# Patient Record
Sex: Female | Born: 1935 | Race: White | Hispanic: No | Smoking: Never smoker
Health system: Southern US, Community
[De-identification: ages and names within clinical notes are randomized; demographics above are authoritative.]

## PROBLEM LIST (undated history)

## (undated) DIAGNOSIS — I509 Heart failure, unspecified: Secondary | ICD-10-CM

## (undated) DIAGNOSIS — G629 Polyneuropathy, unspecified: Secondary | ICD-10-CM

## (undated) DIAGNOSIS — I10 Essential (primary) hypertension: Secondary | ICD-10-CM

## (undated) DIAGNOSIS — E119 Type 2 diabetes mellitus without complications: Secondary | ICD-10-CM

## (undated) DIAGNOSIS — M1990 Unspecified osteoarthritis, unspecified site: Secondary | ICD-10-CM

## (undated) HISTORY — PX: OTHER SURGICAL HISTORY: SHX169

---

## 1998-06-22 ENCOUNTER — Ambulatory Visit (HOSPITAL_COMMUNITY): Admission: RE | Admit: 1998-06-22 | Discharge: 1998-06-22 | Payer: Self-pay | Admitting: Cardiovascular Disease

## 2013-10-08 ENCOUNTER — Encounter (HOSPITAL_COMMUNITY): Payer: Self-pay | Admitting: Emergency Medicine

## 2013-10-08 ENCOUNTER — Emergency Department (HOSPITAL_COMMUNITY): Payer: Medicare HMO

## 2013-10-08 ENCOUNTER — Observation Stay (HOSPITAL_COMMUNITY)
Admission: EM | Admit: 2013-10-08 | Discharge: 2013-10-09 | Disposition: A | Discharge status: Home / Self Care | Payer: Medicare HMO | Attending: Internal Medicine | Admitting: Internal Medicine

## 2013-10-08 DIAGNOSIS — Z79899 Other long term (current) drug therapy: Secondary | ICD-10-CM | POA: Diagnosis not present

## 2013-10-08 DIAGNOSIS — E785 Hyperlipidemia, unspecified: Secondary | ICD-10-CM | POA: Diagnosis present

## 2013-10-08 DIAGNOSIS — M129 Arthropathy, unspecified: Secondary | ICD-10-CM | POA: Diagnosis not present

## 2013-10-08 DIAGNOSIS — Z888 Allergy status to other drugs, medicaments and biological substances status: Secondary | ICD-10-CM | POA: Diagnosis not present

## 2013-10-08 DIAGNOSIS — E119 Type 2 diabetes mellitus without complications: Secondary | ICD-10-CM | POA: Diagnosis not present

## 2013-10-08 DIAGNOSIS — G459 Transient cerebral ischemic attack, unspecified: Secondary | ICD-10-CM | POA: Diagnosis present

## 2013-10-08 DIAGNOSIS — R29898 Other symptoms and signs involving the musculoskeletal system: Secondary | ICD-10-CM | POA: Diagnosis present

## 2013-10-08 DIAGNOSIS — D649 Anemia, unspecified: Secondary | ICD-10-CM | POA: Diagnosis not present

## 2013-10-08 DIAGNOSIS — R5383 Other fatigue: Secondary | ICD-10-CM

## 2013-10-08 DIAGNOSIS — I10 Essential (primary) hypertension: Secondary | ICD-10-CM | POA: Diagnosis present

## 2013-10-08 DIAGNOSIS — I509 Heart failure, unspecified: Secondary | ICD-10-CM | POA: Diagnosis not present

## 2013-10-08 DIAGNOSIS — R4182 Altered mental status, unspecified: Secondary | ICD-10-CM | POA: Diagnosis not present

## 2013-10-08 DIAGNOSIS — R531 Weakness: Secondary | ICD-10-CM | POA: Diagnosis present

## 2013-10-08 DIAGNOSIS — M109 Gout, unspecified: Secondary | ICD-10-CM | POA: Diagnosis present

## 2013-10-08 DIAGNOSIS — Z886 Allergy status to analgesic agent status: Secondary | ICD-10-CM | POA: Diagnosis not present

## 2013-10-08 DIAGNOSIS — R5381 Other malaise: Secondary | ICD-10-CM

## 2013-10-08 HISTORY — DX: Polyneuropathy, unspecified: G62.9

## 2013-10-08 HISTORY — DX: Heart failure, unspecified: I50.9

## 2013-10-08 HISTORY — DX: Type 2 diabetes mellitus without complications: E11.9

## 2013-10-08 HISTORY — DX: Essential (primary) hypertension: I10

## 2013-10-08 HISTORY — DX: Unspecified osteoarthritis, unspecified site: M19.90

## 2013-10-08 LAB — I-STAT TROPONIN, ED: Troponin i, poc: 0 ng/mL (ref 0.00–0.08)

## 2013-10-08 LAB — BASIC METABOLIC PANEL
Anion gap: 11 (ref 5–15)
BUN: 23 mg/dL (ref 6–23)
CO2: 30 mEq/L (ref 19–32)
Calcium: 9.6 mg/dL (ref 8.4–10.5)
Chloride: 96 mEq/L (ref 96–112)
Creatinine, Ser: 1.15 mg/dL — ABNORMAL HIGH (ref 0.50–1.10)
GFR calc Af Amer: 50 mL/min — ABNORMAL LOW (ref >90)
GFR calc non Af Amer: 43 mL/min — ABNORMAL LOW (ref >90)
Glucose, Bld: 128 mg/dL — ABNORMAL HIGH (ref 70–99)
Potassium: 3.9 mEq/L (ref 3.7–5.3)
Sodium: 137 mEq/L (ref 137–147)

## 2013-10-08 LAB — I-STAT CHEM 8, ED
BUN: 23 mg/dL (ref 6–23)
Calcium, Ion: 1.17 mmol/L (ref 1.13–1.30)
Chloride: 96 mEq/L (ref 96–112)
Creatinine, Ser: 1.2 mg/dL — ABNORMAL HIGH (ref 0.50–1.10)
Glucose, Bld: 136 mg/dL — ABNORMAL HIGH (ref 70–99)
HCT: 36 % (ref 36.0–46.0)
Hemoglobin: 12.2 g/dL (ref 12.0–15.0)
Potassium: 3.8 mEq/L (ref 3.7–5.3)
Sodium: 133 mEq/L — ABNORMAL LOW (ref 137–147)
TCO2: 29 mmol/L (ref 0–100)

## 2013-10-08 LAB — URINALYSIS, ROUTINE W REFLEX MICROSCOPIC
Bilirubin Urine: NEGATIVE
Glucose, UA: NEGATIVE mg/dL
Hgb urine dipstick: NEGATIVE
Ketones, ur: NEGATIVE mg/dL
LEUKOCYTES UA: NEGATIVE
NITRITE: NEGATIVE
PROTEIN: NEGATIVE mg/dL
Specific Gravity, Urine: 1.01 (ref 1.005–1.030)
UROBILINOGEN UA: 0.2 mg/dL (ref 0.0–1.0)
pH: 6 (ref 5.0–8.0)

## 2013-10-08 LAB — CBC
HCT: 35 % — ABNORMAL LOW (ref 36.0–46.0)
Hemoglobin: 11.6 g/dL — ABNORMAL LOW (ref 12.0–15.0)
MCH: 28.4 pg (ref 26.0–34.0)
MCHC: 33.1 g/dL (ref 30.0–36.0)
MCV: 85.8 fL (ref 78.0–100.0)
Platelets: 275 10*3/uL (ref 150–400)
RBC: 4.08 MIL/uL (ref 3.87–5.11)
RDW: 12.7 % (ref 11.5–15.5)
WBC: 9.4 10*3/uL (ref 4.0–10.5)

## 2013-10-08 LAB — GLUCOSE, CAPILLARY: GLUCOSE-CAPILLARY: 115 mg/dL — AB (ref 70–99)

## 2013-10-08 LAB — SEDIMENTATION RATE: SED RATE: 72 mm/h — AB (ref 0–22)

## 2013-10-08 LAB — C-REACTIVE PROTEIN: CRP: 3 mg/dL — AB (ref <0.60)

## 2013-10-08 MED ORDER — ACETAMINOPHEN 325 MG PO TABS
650.0000 mg | ORAL_TABLET | ORAL | Status: DC | PRN
  Administered 2013-10-09: 650 mg via ORAL
  Filled 2013-10-08: qty 2

## 2013-10-08 MED ORDER — LABETALOL HCL 100 MG PO TABS
100.0000 mg | ORAL_TABLET | Freq: Two times a day (BID) | ORAL | Status: DC
  Administered 2013-10-09 (×2): 100 mg via ORAL
  Filled 2013-10-08 (×2): qty 1

## 2013-10-08 MED ORDER — IRBESARTAN 300 MG PO TABS
300.0000 mg | ORAL_TABLET | Freq: Every day | ORAL | Status: DC
  Administered 2013-10-09 (×2): 300 mg via ORAL
  Filled 2013-10-08 (×2): qty 1

## 2013-10-08 MED ORDER — INSULIN ASPART 100 UNIT/ML ~~LOC~~ SOLN: 0.0000 [IU] | Freq: Three times a day (TID) | SUBCUTANEOUS | Status: DC

## 2013-10-08 MED ORDER — VITAMIN D 1000 UNITS PO TABS: 1000.0000 [IU] | ORAL_TABLET | Freq: Every day | ORAL | Status: DC

## 2013-10-08 MED ORDER — ENOXAPARIN SODIUM 40 MG/0.4ML ~~LOC~~ SOLN
40.0000 mg | SUBCUTANEOUS | Status: DC
  Administered 2013-10-09: 40 mg via SUBCUTANEOUS
  Filled 2013-10-08: qty 0.4

## 2013-10-08 MED ORDER — OMEGA-3-ACID ETHYL ESTERS 1 G PO CAPS
2.0000 g | ORAL_CAPSULE | Freq: Every day | ORAL | Status: DC
  Administered 2013-10-09: 2 g via ORAL
  Filled 2013-10-08: qty 2

## 2013-10-08 MED ORDER — ACETAMINOPHEN 500 MG PO TABS
1000.0000 mg | ORAL_TABLET | Freq: Once | ORAL | Status: AC
  Administered 2013-10-08: 1000 mg via ORAL
  Filled 2013-10-08: qty 2

## 2013-10-08 MED ORDER — CHROMIUM 200 MCG PO TABS: 200.0000 ug | ORAL_TABLET | Freq: Every day | ORAL | Status: DC

## 2013-10-08 MED ORDER — BIOTIN 5 MG PO TABS: 5.0000 mg | ORAL_TABLET | Freq: Every morning | ORAL | Status: DC

## 2013-10-08 MED ORDER — ALLOPURINOL 100 MG PO TABS: 300.0000 mg | ORAL_TABLET | ORAL | Status: DC

## 2013-10-08 MED ORDER — LORAZEPAM 2 MG/ML IJ SOLN: 1.0000 mg | Freq: Once | INTRAMUSCULAR | Status: DC

## 2013-10-08 MED ORDER — STROKE: EARLY STAGES OF RECOVERY BOOK
Freq: Once | Status: AC
  Administered 2013-10-08: 23:00:00
  Filled 2013-10-08: qty 1

## 2013-10-08 MED ORDER — SODIUM CHLORIDE 0.9 % IV SOLN
INTRAVENOUS | Status: DC
  Administered 2013-10-09: 02:00:00 via INTRAVENOUS

## 2013-10-08 MED ORDER — FISH OIL PLUS CO Q-10 1000-30 MG PO CAPS: 2.0000 | ORAL_CAPSULE | Freq: Every day | ORAL | Status: DC

## 2013-10-08 MED ORDER — LORATADINE 10 MG PO TABS
10.0000 mg | ORAL_TABLET | Freq: Every day | ORAL | Status: DC
  Administered 2013-10-09: 10 mg via ORAL
  Filled 2013-10-08: qty 1

## 2013-10-08 MED ORDER — ATORVASTATIN CALCIUM 10 MG PO TABS: 10.0000 mg | ORAL_TABLET | Freq: Every day | ORAL | Status: DC

## 2013-10-08 MED ORDER — OCUVITE PO TABS
1.0000 | ORAL_TABLET | Freq: Every day | ORAL | Status: DC
  Administered 2013-10-09 (×2): 1 via ORAL
  Filled 2013-10-08 (×2): qty 1

## 2013-10-08 NOTE — Consult Note (Signed)
Neurology Consultation Reason for Consult: Lower extremity weakness Referring Physician: Venora Maples, K.  CC: Lower extremity weakness  History is obtained from: Patient, daughter  HPI: Sherri Long is a 80 y.o. female who has had 4 episodes of transient altered mental status associated with a lower extremity weakness. She states that her daughter was talking to her, but she just felt comfortable and did not feel the need to respond. She understood what was being said to her.  She has memory of the event. She recovered with with each of the events over the course of a few minutes.  Also of note, she has recently had shoulder girdle pain and stiffness that progressed over the course of a couple of weeks. She was started on prednisone with rapid and complete resolution of the symptoms, however after stopping prednisone she has had recurrence.  ROS: A 14 point ROS was performed and is negative except as noted in the HPI.   Past Medical History  Diagnosis Date  . Arthritis   . Neuropathy   . Diabetes mellitus without complication   . Hypertension   . CHF (congestive heart failure)     Family History: Grandchildren-? Seizures  Social History: Tob: Denies  Exam: Current vital signs: BP 175/74  Pulse 71  Temp(Src) 100.6 F (38.1 C) (Rectal)  Resp 26  SpO2 98% Vital signs in last 24 hours: Temp:  [98.3 F (36.8 C)-100.6 F (38.1 C)] 100.6 F (38.1 C) (08/20 1913) Pulse Rate:  [60-71] 71 (08/20 2000) Resp:  [16-26] 26 (08/20 1937) BP: (156-175)/(64-76) 175/74 mmHg (08/20 2000) SpO2:  [98 %-100 %] 98 % (08/20 1937)  General: In bed, NAD CV: Regular rate and rhythm Mental Status: Patient is awake, alert, oriented to person, place, month, year, and situation. Immediate and remote memory are intact. Patient is able to give a clear and coherent history. No signs of aphasia or neglect Cranial Nerves: II: Visual Fields are full. Pupils are equal, round, and reactive to light.   Discs are difficult to visualize. III,IV, VI: EOMI without ptosis or diploplia.  V: Facial sensation is symmetric to temperature VII: Facial movement is symmetric.  VIII: hearing is intact to voice X: Uvula elevates symmetrically XI: Shoulder shrug is symmetric. XII: tongue is midline without atrophy or fasciculations.  Motor: Tone is normal. Bulk is normal. 5/5 strength was present in all four extremities, the proximal strength in the arms is limited secondary to pain. Sensory: Sensation is symmetric to light touch and temperature in the arms and legs. Deep Tendon Reflexes: 2+ and symmetric in the biceps and patellae.  Plantars: Toes are downgoing bilaterally.  Cerebellar: FNF and HKS are intact bilaterally Gait: Not tested secondary to multiple medical monitors in ED setting   I have reviewed labs in epic and the results pertinent to this consultation are: ESR 72  Impression: 80 year old female with recurrent episodes of altered mental status and lower extremity weakness. Possible etiologies include presyncope in the setting of an inflammatory state, partial seizures. If she does have a single trunk for her ACA, and TIA would be a possibility, though I feel is less likely given the bilateral nature of her leg weakness.  With the symptoms of progressive upper extremity myalgias, elevated ESR, rapid and complete response to prednisone I strongly suspect polymyalgia rheumatica. She has no headache or other signs of temporal artery involvement.  Recommendations: 1) MRI/MRA brain 2) EEG 3) carotid dopplers 4) A1c, LDL 5) will  Continue to follow.  6) I  would consider restarting her prednisone given that I suspect polymyalgia rheumatica.   Roland Rack, MD [facility] [phone]  If 7pm- 7am, please page neurology on call as listed in [facility].

## 2013-10-08 NOTE — ED Notes (Signed)
Dr.K, internal medicine MD, at bedside

## 2013-10-08 NOTE — ED Notes (Signed)
Pt reports since yesterday has been weak, to the point she couldn't stand. Today reports seizure like episodes, starts with shaking in her head and down her body, it makes her feel like she will collapse but has not. Is alert and oriented. Reports headache last night. Equal grip strengths, reports right shoulder blade pain but denies recent injury.

## 2013-10-08 NOTE — ED Notes (Signed)
Pt c/o weakness x 1 day; reports feeling dizzy when changing positions from sitting to standing.  Reports standing up today, felt shaky and grabbed onto a post.  Pt denies falling, reports family helped hold onto her. Also c/o R shoulder soreness starting today. Denies chest pain or shortness of breath.

## 2013-10-08 NOTE — ED Notes (Signed)
Dr. Floyd at bedside. 

## 2013-10-08 NOTE — Progress Notes (Addendum)
PHARMACIST - PHYSICIAN ORDER COMMUNICATION  CONCERNING: P&T Medication Policy on Herbal Medications  DESCRIPTION:  This patient's order for:  Chromium, Biotin and Coenzyme Q10 has been noted.  This product(s) is classified as an "herbal" or natural product. Due to a lack of definitive safety studies or FDA approval, nonstandard manufacturing practices, plus the potential risk of unknown drug-drug interactions while on inpatient medications, the Pharmacy and Therapeutics Committee does not permit the use of "herbal" or natural products of this type within [facility].   ACTION TAKEN: The pharmacy department is unable to verify this order at this time and your patient has been informed of this safety policy. Please reevaluate patient's clinical condition at discharge and address if the herbal or natural product(s) should be resumed at that time.   Nicole Cella, RPh Clinical Pharmacist Pager: [phone] 10/08/2013 11:02 PM

## 2013-10-08 NOTE — ED Notes (Signed)
Rosalyn Gess, daughter, [phone] cell; call if needed

## 2013-10-08 NOTE — H&P (Signed)
[facility] History and Physical  Sherri Long SHU:[id number] DOB: April 27, 1930 DOA: 10/08/2013  Referring physician: ER physician. PCP: No primary provider on file.  Chief Complaint: Lower extremity weakness.  HPI: Sherri Long is a 80 y.o. female with history of diabetes mellitus, hypertension, hyperlipidemia and gout was brought to the ER because patient has been having some lower extremity weakness. As per patient and patient's daughter yesterday around 1 PM while patient was in the car with her daughter patient was not responding to patient's daughter's questions. Patient states that she was aware that she has been questioned but she thought he need not have to onset them. When they reached home patient felt weak to get out of the car because she was not able to move her lower extremities well. She also had a small jerking spell. She did not lose consciousness or have any incontinence of urine or tongue bite. Patient's daughter went to get some help and after coming back patient was able to stand and walk by herself. Since then patient has been having some weakness of the lower extremity with transient episodes of jerking. Patient also had some shoulder pain recently which improved with prednisone taper. In the ER patient was found to be nonfocal. CT of the head was negative for anything acute. Sedimentation rate was found to be elevated. On-call neurologist has been consulted and patient has been admitted for further workup. Patient denies any chest pain shortness of breath nausea vomiting visual symptoms or any difficulty swallowing. Has not lost function of the upper extremities.    Review of Systems: As presented in the history of presenting illness, rest negative.  Past Medical History  Diagnosis Date  . Arthritis   . Neuropathy   . Diabetes mellitus without complication   . Hypertension   . CHF (congestive heart failure)    Past Surgical History  Procedure Laterality Date  .  Right shoulder      Social History:  reports that she has never smoked. She does not have any smokeless tobacco history on file. She reports that she does not drink alcohol. Her drug history is not on file. Where does patient live home. Can patient participate in ADLs? Yes.  Allergies  Allergen Reactions  . Aspirin Hives, Shortness Of Breath, Itching, Swelling, Rash and Other (See Comments)    Allergy attack-per patient   . Ibuprofen Hives, Shortness Of Breath, Itching, Swelling, Rash and Other (See Comments)    Allergy attack-per patient  . Nsaids Hives, Shortness Of Breath, Itching, Swelling, Rash and Other (See Comments)    MD stated to not take them    Family History:  Family History  Problem Relation Age of Onset  . Diabetes Mellitus II Sister   . Stroke Sister   . Stroke Brother   . Hypothyroidism Daughter       Prior to Admission medications   Medication Sig Start Date End Date Taking? Authorizing Provider  allopurinol (ZYLOPRIM) 300 MG tablet Take 300 mg by mouth 4 (four) times a week.    Yes Historical Provider, MD  beta carotene w/minerals (OCUVITE) tablet Take 1 tablet by mouth daily.   Yes Historical Provider, MD  Biotin 5 MG TABS Take 5 mg by mouth every morning.    Yes Historical Provider, MD  cholecalciferol (VITAMIN D) 1000 UNITS tablet Take 1,000 Units by mouth at bedtime.    Yes Historical Provider, MD  Chromium 200 MCG TABS Take 200 mcg by mouth daily.   Yes Historical  Provider, MD  Fish Oil-Coenzyme Q10 (FISH OIL PLUS CO Q-10) 1000-30 MG CAPS Take 2 capsules by mouth daily.    Yes Historical Provider, MD  labetalol (NORMODYNE) 100 MG tablet Take 100 mg by mouth 2 (two) times daily.   Yes Historical Provider, MD  Liniments ([place] PAIN RELIEF PATCH) PADS Apply 1 each topically daily as needed (for pain).   Yes Historical Provider, MD  loratadine (CLARITIN) 10 MG tablet Take 10 mg by mouth at bedtime.    Yes Historical Provider, MD  metFORMIN (GLUMETZA) 500 MG  (MOD) 24 hr tablet Take 500 mg by mouth at bedtime.    Yes Historical Provider, MD  OVER THE COUNTER MEDICATION Apply 1 application topically daily as needed (for pain).   Yes Historical Provider, MD  rosuvastatin (CRESTOR) 5 MG tablet Take 2.5 mg by mouth 4 (four) times a week. In PM   Yes Historical Provider, MD  traMADol (ULTRAM) 50 MG tablet Take 50 mg by mouth every 12 (twelve) hours as needed for moderate pain.   Yes Historical Provider, MD  valsartan-hydrochlorothiazide (DIOVAN-HCT) 320-25 MG per tablet Take 1 tablet by mouth daily.   Yes Historical Provider, MD    Physical Exam: Filed Vitals:   10/08/13 1800 10/08/13 1913 10/08/13 1937 10/08/13 2000  BP: 158/76 157/66 156/70 175/74  Pulse: 60  65 71  Temp:  100.6 F (38.1 C)    TempSrc:  Rectal    Resp: 19 23 26    SpO2: 100% 98% 98%      General:  Well-developed and nourished.  Eyes: Anicteric no pallor.  ENT: No discharge from the ears eyes nose mouth.  Neck: No mass felt. No neck rigidity.  Cardiovascular: S1-S2 heard.  Respiratory: No rhonchi or crepitations.  Abdomen: Soft nontender bowel sounds present. No guarding or rigidity.  Skin: No rash.  Musculoskeletal: No edema. No muscle tenderness or effusions in the joints.  Psychiatric: Appears normal.  Neurologic: Alert awake oriented to time place and person. Moves all extremities 5 x 5. No facial asymmetry. Tongue is midline.  Labs on Admission:  Basic Metabolic Panel:  Recent Labs Lab 10/08/13 1631 10/08/13 1645  NA 137 133*  K 3.9 3.8  CL 96 96  CO2 30  --   GLUCOSE 128* 136*  BUN 23 23  CREATININE 1.15* 1.20*  CALCIUM 9.6  --    Liver Function Tests: No results found for this basename: AST, ALT, ALKPHOS, BILITOT, PROT, ALBUMIN,  in the last 168 hours No results found for this basename: LIPASE, AMYLASE,  in the last 168 hours No results found for this basename: AMMONIA,  in the last 168 hours CBC:  Recent Labs Lab 10/08/13 1631  10/08/13 1645  WBC 9.4  --   HGB 11.6* 12.2  HCT 35.0* 36.0  MCV 85.8  --   PLT 275  --    Cardiac Enzymes: No results found for this basename: CKTOTAL, CKMB, CKMBINDEX, TROPONINI,  in the last 168 hours  BNP (last 3 results) No results found for this basename: PROBNP,  in the last 8760 hours CBG: No results found for this basename: GLUCAP,  in the last 168 hours  Radiological Exams on Admission: Dg Chest 2 View  10/08/2013   CLINICAL DATA:  Weakness and right shoulder blade pain.  EXAM: CHEST  2 VIEW  COMPARISON:  05/23/2004  FINDINGS: Mild enlargement of the heart is unchanged. Lungs are clear without airspace disease or edema. Evidence for mild scarring at the lung  apices. No acute bone abnormality.  IMPRESSION: No active cardiopulmonary disease.   Electronically Signed   By: Markus Daft M.D.   On: 10/08/2013 18:52    EKG: Independently reviewed. Normal sinus rhythm.  Assessment/Plan Principal Problem:   Weakness Active Problems:   Diabetes mellitus type 2, controlled   HTN (hypertension)   HLD (hyperlipidemia)   Gout   1. Lower extremity weakness - have discussed with on-call neurologist Dr. Leonel Ramsay. At this time primary concern is for polymyalgia rheumatica but differentials include TIA and also to rule out any seizure-like activities for which patient will be placed on neurochecks and MRI/MRA brain carotid Doppler 2-D echo and EEG has been ordered. Further recommendations per neurologist. Physical therapy consult. 2. Diabetes mellitus - closely follow CBGs as patient is likely to be started on prednisone see #1. 3. Hypertension - hold HCTZ and gently hydrate for now continue other medications. 4. Hyperlipidemia - on statins. Check CK. 5. Anemia - check anemia panel. 6. Mildly febrile - may be from #1. Chest x-ray and UA are unremarkable. 7. Gout - continue home medications.    Code Status: Full code.  Family Communication: Patient's daughter at the bedside.   Disposition Plan: Admit to inpatient.    Marcellino Fidalgo N. [facility] Pager [phone].  If 7PM-7AM, please contact night-coverage [email or web address] Password [email or web address] 10/08/2013, 9:10 PM

## 2013-10-08 NOTE — ED Notes (Signed)
MRI aware of patient ready for scan

## 2013-10-08 NOTE — ED Provider Notes (Signed)
CSN: [id number]     Arrival date & time 10/08/13  1613 History   First MD Initiated Contact with Patient 10/08/13 1710     Chief Complaint  Patient presents with  . Weakness     (Consider location/radiation/quality/duration/timing/severity/associated sxs/prior Treatment) Patient is a 80 y.o. female presenting with general illness. The history is provided by the patient.  Illness Severity:  Mild Onset quality:  Sudden Duration:  2 days Timing:  Constant Progression:  Waxing and waning Chronicity:  New Associated symptoms: fatigue and myalgias   Associated symptoms: no chest pain, no congestion, no fever, no headaches, no nausea, no rhinorrhea, no shortness of breath, no vomiting and no wheezing    80 yo F with a chief complaint of bilateral lower extremity weakness. Patient says that yesterday she had a period where she has complete paralysis of bilateral lower extremities. Patient has also had episodes where she is unresponsive and shakes all over. Denies any loss of bowel or bladder with this denies any biting of the tongue. Patient does not remember any of these events. Patient denies any head injuries. Patient denies any fevers or chills. Patient has had some T-spine tenderness.   Past Medical History  Diagnosis Date  . Arthritis   . Neuropathy   . Diabetes mellitus without complication   . Hypertension   . CHF (congestive heart failure)    Past Surgical History  Procedure Laterality Date  . Right shoulder      Family History  Problem Relation Age of Onset  . Diabetes Mellitus II Sister   . Stroke Sister   . Stroke Brother   . Hypothyroidism Daughter    History  Substance Use Topics  . Smoking status: Never Smoker   . Smokeless tobacco: Not on file  . Alcohol Use: No   OB History   Grav Para Term Preterm Abortions TAB SAB Ect Mult Living                 Review of Systems  Constitutional: Positive for fatigue. Negative for fever and chills.  HENT: Negative for  congestion and rhinorrhea.   Eyes: Negative for redness and visual disturbance.  Respiratory: Negative for shortness of breath and wheezing.   Cardiovascular: Negative for chest pain and palpitations.  Gastrointestinal: Negative for nausea and vomiting.  Genitourinary: Negative for dysuria and urgency.  Musculoskeletal: Positive for arthralgias, back pain and myalgias.  Skin: Negative for pallor and wound.  Neurological: Negative for dizziness and headaches.      Allergies  Aspirin; Ibuprofen; and Nsaids  Home Medications   Prior to Admission medications   Medication Sig Start Date End Date Taking? Authorizing Provider  allopurinol (ZYLOPRIM) 300 MG tablet Take 300 mg by mouth 4 (four) times a week.    Yes Historical Provider, MD  beta carotene w/minerals (OCUVITE) tablet Take 1 tablet by mouth daily.   Yes Historical Provider, MD  Biotin 5 MG TABS Take 5 mg by mouth every morning.    Yes Historical Provider, MD  cholecalciferol (VITAMIN D) 1000 UNITS tablet Take 1,000 Units by mouth at bedtime.    Yes Historical Provider, MD  Chromium 200 MCG TABS Take 200 mcg by mouth daily.   Yes Historical Provider, MD  Fish Oil-Coenzyme Q10 (FISH OIL PLUS CO Q-10) 1000-30 MG CAPS Take 2 capsules by mouth daily.    Yes Historical Provider, MD  labetalol (NORMODYNE) 100 MG tablet Take 100 mg by mouth 2 (two) times daily.   Yes Historical  Provider, MD  Liniments [facility] PAIN RELIEF PATCH) PADS Apply 1 each topically daily as needed (for pain).   Yes Historical Provider, MD  loratadine (CLARITIN) 10 MG tablet Take 10 mg by mouth at bedtime.    Yes Historical Provider, MD  metFORMIN (GLUMETZA) 500 MG (MOD) 24 hr tablet Take 500 mg by mouth at bedtime.    Yes Historical Provider, MD  OVER THE COUNTER MEDICATION Apply 1 application topically daily as needed (for pain).   Yes Historical Provider, MD  rosuvastatin (CRESTOR) 5 MG tablet Take 2.5 mg by mouth 4 (four) times a week. In PM   Yes Historical  Provider, MD  traMADol (ULTRAM) 50 MG tablet Take 50 mg by mouth every 12 (twelve) hours as needed for moderate pain.   Yes Historical Provider, MD  valsartan-hydrochlorothiazide (DIOVAN-HCT) 320-25 MG per tablet Take 1 tablet by mouth daily.   Yes Historical Provider, MD   BP 128/41  Pulse 62  Temp(Src) 98.5 F (36.9 C) (Oral)  Resp 20  Ht _0  (1.6 m)  Wt 151 lb 6.4 oz (68.675 kg)  BMI 26.83 kg/m2  SpO2 99% Physical Exam  Constitutional: She is oriented to person, place, and time. She appears well-developed and well-nourished. No distress.  HENT:  Head: Normocephalic and atraumatic.  Eyes: EOM are normal. Pupils are equal, round, and reactive to light.  Neck: Normal range of motion. Neck supple.  Cardiovascular: Normal rate and regular rhythm.  Exam reveals no gallop and no friction rub.   No murmur heard. Pulmonary/Chest: Effort normal. She has no wheezes. She has no rales.  Abdominal: Soft. She exhibits no distension. There is no tenderness. There is no rebound and no guarding.  Musculoskeletal: She exhibits no edema and no tenderness.  Neurological: She is alert and oriented to person, place, and time.  Skin: Skin is warm and dry. She is not diaphoretic.  Psychiatric: She has a normal mood and affect. Her behavior is normal.    ED Course  Procedures (including critical care time) Labs Review Labs Reviewed  BASIC METABOLIC PANEL - Abnormal; Notable for the following:    Glucose, Bld 128 (*)    Creatinine, Ser 1.15 (*)    GFR calc non Af Amer 43 (*)    GFR calc Af Amer 50 (*)    All other components within normal limits  CBC - Abnormal; Notable for the following:    Hemoglobin 11.6 (*)    HCT 35.0 (*)    All other components within normal limits  SEDIMENTATION RATE - Abnormal; Notable for the following:    Sed Rate 72 (*)    All other components within normal limits  C-REACTIVE PROTEIN - Abnormal; Notable for the following:    CRP 3.0 (*)    All other components  within normal limits  GLUCOSE, CAPILLARY - Abnormal; Notable for the following:    Glucose-Capillary 115 (*)    All other components within normal limits  I-STAT CHEM 8, ED - Abnormal; Notable for the following:    Sodium 133 (*)    Creatinine, Ser 1.20 (*)    Glucose, Bld 136 (*)    All other components within normal limits  URINALYSIS, ROUTINE W REFLEX MICROSCOPIC  TSH  COMPREHENSIVE METABOLIC PANEL  CK  URINE RAPID DRUG SCREEN (HOSP PERFORMED)  HEMOGLOBIN A1C  LIPID PANEL  I-STAT TROPOININ, ED    Imaging Review Dg Chest 2 View  10/08/2013   CLINICAL DATA:  Weakness and right shoulder blade pain.  EXAM: CHEST  2 VIEW  COMPARISON:  05/23/2004  FINDINGS: Mild enlargement of the heart is unchanged. Lungs are clear without airspace disease or edema. Evidence for mild scarring at the lung apices. No acute bone abnormality.  IMPRESSION: No active cardiopulmonary disease.   Electronically Signed   By: Markus Daft M.D.   On: 10/08/2013 18:52     EKG Interpretation None      MDM   Final diagnoses:  Weakness  Diabetes mellitus type 2, controlled  HLD (hyperlipidemia)  Essential hypertension    80 yo F with a chief complaint of bilateral lower extremity weakness. Patient with an elevated ESR and CRP patient with a low-grade temperature here in the ED rectally. No noted focal signs of infection on chest x-ray urinalysis. Neurology consult to feel that these episodes may be TIAs versus seizures. We'll admit the patient into the hospital.    Deno Etienne, MD 10/09/13 [phone]

## 2013-10-08 NOTE — ED Notes (Signed)
Dr. Kirkpatrick at bedside 

## 2013-10-08 NOTE — ED Notes (Signed)
MRI tech reports patient MRI rescheduled for in the morning

## 2013-10-08 NOTE — ED Notes (Signed)
Ambulated pt. No signs of dizziness. Pt's gait was steady and normal.

## 2013-10-09 ENCOUNTER — Inpatient Hospital Stay (HOSPITAL_COMMUNITY): Payer: Medicare HMO

## 2013-10-09 DIAGNOSIS — G459 Transient cerebral ischemic attack, unspecified: Secondary | ICD-10-CM

## 2013-10-09 LAB — RAPID URINE DRUG SCREEN, HOSP PERFORMED
Amphetamines: NOT DETECTED
BARBITURATES: NOT DETECTED
BENZODIAZEPINES: NOT DETECTED
COCAINE: NOT DETECTED
Opiates: NOT DETECTED
Tetrahydrocannabinol: NOT DETECTED

## 2013-10-09 LAB — VITAMIN B12: VITAMIN B 12: 792 pg/mL (ref 211–911)

## 2013-10-09 LAB — GLUCOSE, CAPILLARY
GLUCOSE-CAPILLARY: 77 mg/dL (ref 70–99)
Glucose-Capillary: 135 mg/dL — ABNORMAL HIGH (ref 70–99)
Glucose-Capillary: 171 mg/dL — ABNORMAL HIGH (ref 70–99)

## 2013-10-09 LAB — IRON AND TIBC
Iron: 38 ug/dL — ABNORMAL LOW (ref 42–135)
Saturation Ratios: 12 % — ABNORMAL LOW (ref 20–55)
TIBC: 325 ug/dL (ref 250–470)
UIBC: 287 ug/dL (ref 125–400)

## 2013-10-09 LAB — LIPID PANEL
Cholesterol: 129 mg/dL (ref 0–200)
HDL: 57 mg/dL (ref >39)
LDL Cholesterol: 53 mg/dL (ref 0–99)
Total CHOL/HDL Ratio: 2.3 RATIO
Triglycerides: 97 mg/dL (ref <150)
VLDL: 19 mg/dL (ref 0–40)

## 2013-10-09 LAB — COMPREHENSIVE METABOLIC PANEL
ALBUMIN: 3 g/dL — AB (ref 3.5–5.2)
ALT: 13 U/L (ref 0–35)
AST: 24 U/L (ref 0–37)
Alkaline Phosphatase: 86 U/L (ref 39–117)
Anion gap: 12 (ref 5–15)
BUN: 24 mg/dL — ABNORMAL HIGH (ref 6–23)
CALCIUM: 9.4 mg/dL (ref 8.4–10.5)
CO2: 28 mEq/L (ref 19–32)
Chloride: 95 mEq/L — ABNORMAL LOW (ref 96–112)
Creatinine, Ser: 1.05 mg/dL (ref 0.50–1.10)
GFR calc Af Amer: 56 mL/min — ABNORMAL LOW (ref >90)
GFR calc non Af Amer: 48 mL/min — ABNORMAL LOW (ref >90)
Glucose, Bld: 76 mg/dL (ref 70–99)
Potassium: 4 mEq/L (ref 3.7–5.3)
Sodium: 135 mEq/L — ABNORMAL LOW (ref 137–147)
TOTAL PROTEIN: 6.4 g/dL (ref 6.0–8.3)
Total Bilirubin: 0.5 mg/dL (ref 0.3–1.2)

## 2013-10-09 LAB — HEMOGLOBIN A1C
HEMOGLOBIN A1C: 6.5 % — AB (ref <5.7)
MEAN PLASMA GLUCOSE: 140 mg/dL — AB (ref <117)

## 2013-10-09 LAB — CK: Total CK: 62 U/L (ref 7–177)

## 2013-10-09 LAB — FOLATE: Folate: >20 ng/mL

## 2013-10-09 LAB — RETICULOCYTES
RBC.: 3.95 MIL/uL (ref 3.87–5.11)
RETIC CT PCT: 1.2 % (ref 0.4–3.1)
Retic Count, Absolute: 47.4 10*3/uL (ref 19.0–186.0)

## 2013-10-09 LAB — FERRITIN: FERRITIN: 76 ng/mL (ref 10–291)

## 2013-10-09 LAB — TSH: TSH: 2.86 u[IU]/mL (ref 0.350–4.500)

## 2013-10-09 MED ORDER — PREDNISONE 5 MG PO TABS: 15.0000 mg | ORAL_TABLET | Freq: Every day | ORAL | Status: AC

## 2013-10-09 MED ORDER — PREDNISONE 5 MG PO TABS
15.0000 mg | ORAL_TABLET | Freq: Every day | ORAL | Status: DC
  Administered 2013-10-09: 15 mg via ORAL
  Filled 2013-10-09: qty 3

## 2013-10-09 MED ORDER — CLOPIDOGREL BISULFATE 75 MG PO TABS: 75.0000 mg | ORAL_TABLET | Freq: Every day | ORAL | Status: AC

## 2013-10-09 MED ORDER — PANTOPRAZOLE SODIUM 40 MG PO TBEC: 40.0000 mg | DELAYED_RELEASE_TABLET | Freq: Every day | ORAL | Status: AC

## 2013-10-09 NOTE — Progress Notes (Signed)
Discharge orders received. Dr Allyson Sabal called and spoke to patient regarding her test results and the plan to start plavix. Patient given information regarding scheduling a follow up appointment with PCP and with rheumatology. Medication and stroke education reviewed. Patient discharged via wheelchair in stable condition. Sherri Long, Sherri Long 10/09/2013 6:45 PM

## 2013-10-09 NOTE — Evaluation (Signed)
Physical Therapy Evaluation Patient Details Name: Sherri Long MRN: [id number] DOB: 1931/02/07 Today's Date: 10/09/2013   History of Present Illness  Admitted after several episodes of LE weakness in conjunction with episodes of not responding appropriately to family members.  Clinical Impression  Pt is likely at or approaching baseline functioning.  Any events that brought her to the hospital were either transient or have resolved.  No further PT needs.  Sign off.    Follow Up Recommendations No PT follow up    Equipment Recommendations  None recommended by PT    Recommendations for Other Services       Precautions / Restrictions Precautions Precautions: None      Mobility  Bed Mobility Overal bed mobility: Modified Independent                Transfers Overall transfer level: Modified independent               General transfer comment: safe technique and generally independent  Ambulation/Gait Ambulation/Gait assistance: Supervision Ambulation Distance (Feet): 250 Feet Assistive device: None Gait Pattern/deviations: Step-through pattern Gait velocity: slower   General Gait Details: steady if a bit stiff due to scapular pain  Stairs            Wheelchair Mobility    Modified Rankin (Stroke Patients Only)       Balance Overall balance assessment: No apparent balance deficits (not formally assessed)                                           Pertinent Vitals/Pain Pain Assessment:  (reports trigger pt spot under inf angle R scapula that hurts)    Home Living Family/patient expects to be discharged to:: Private residence Living Arrangements: Alone Available Help at Discharge: Available PRN/intermittently (family incl son and grandaughter live very close by patient) Type of Home: House Home Access: Stairs to enter Entrance Stairs-Rails: Chemical engineer of Steps: 2 Home Layout: One level Home Equipment:  None      Prior Function Level of Independence: Independent               Hand Dominance        Extremity/Trunk Assessment   Upper Extremity Assessment: Overall WFL for tasks assessed           Lower Extremity Assessment: Overall WFL for tasks assessed (L leg mildly weaker than R, but functional)         Communication   Communication: No difficulties  Cognition Arousal/Alertness: Awake/alert Behavior During Therapy: WFL for tasks assessed/performed Overall Cognitive Status: Within Functional Limits for tasks assessed                      General Comments      Exercises        Assessment/Plan    PT Assessment Patent does not need any further PT services  PT Diagnosis     PT Problem List    PT Treatment Interventions     PT Goals (Current goals can be found in the Care Plan section) Acute Rehab PT Goals PT Goal Formulation: No goals set, d/c therapy    Frequency     Barriers to discharge        Co-evaluation               End of Session   Activity Tolerance:  Patient tolerated treatment well Patient left: Other (comment);with call bell/phone within reach (sitting EOB) Nurse Communication: Mobility status         Time: 1255-1316 PT Time Calculation (min): 21 min   Charges:   PT Evaluation $Initial PT Evaluation Tier I: 1 Procedure PT Treatments $Gait Training: 8-22 mins   PT G Codes:          Dalylah Ramey, Tessie Fass 10/09/2013, 1:25 PM 10/09/2013  Donnella Sham, PT [phone]  (pager)

## 2013-10-09 NOTE — ED Provider Notes (Signed)
I saw and evaluated the patient, reviewed the resident's note and I agree with the findings and plan.   EKG Interpretation   Date/Time:  Thursday October 08 2013 16:32:32 EDT Ventricular Rate:  66 PR Interval:  182 QRS Duration: 78 QT Interval:  408 QTC Calculation: 427 R Axis:   11 Text Interpretation:  Normal sinus rhythm Normal ECG No old tracing to  compare Confirmed by Veronica Guerrant  MD, Lennette Bihari ([id number]) on 10/09/2013 12:29:20 AM      Admit for additional workup.  Seizure versus syncope.  Generalized weakness.  Hospitalist consultation.  Overall well-appearing.  Hoy Morn, MD 10/09/13 [phone]

## 2013-10-09 NOTE — Progress Notes (Signed)
UR completed.  Paytin Ramakrishnan, RN BSN [facility] CCM Trauma/Neuro ICU Case Manager [phone]  

## 2013-10-09 NOTE — Progress Notes (Signed)
EEG Completed; Results Pending  

## 2013-10-09 NOTE — Procedures (Signed)
ELECTROENCEPHALOGRAM REPORT  Patient: Sherri Long       Room #: 4V40  EEG No. ID: [id number] Age: 80 y.o.        Sex: female Referring Physician: Allyson Sabal Report Date:  10/09/2013        Interpreting Physician: Anthony Sar  History: Sherri Long is an 80 y.o. female presenting following 4 episodes of transient altered mental status with associated lower extremity weakness.  Indications for study: Rule out partial seizure disorder.   Technique: This is an 18 channel routine scalp EEG performed at the bedside with bipolar and monopolar montages arranged in accordance to the international 10/20 system of electrode placement.   Description: This EEG recording was performed during wakefulness and during light sleep. Background activity during wakefulness consisted of diffuse low amplitude symmetrical beta activity with infrequent occurrences of 10-11 Hz alpha rhythm recorded from posterior head regions. Photic stimulation produced symmetrical occipital driving response. Hyperventilation was not performed. During light sleep symmetrical vertex waves and sleep spindles were recorded along with a background of generalized moderate slowing of cerebral activity. No epileptiform discharges recorded.  Interpretation: This is a normal EEG recording. No evidence of an epileptic disorder was demonstrated. There is also no evidence of an encephalopathic disorder.   Rush Farmer M.D. [facility] [phone]

## 2013-10-09 NOTE — Progress Notes (Signed)
Subjective: No complaints, has had no further symptoms of transient AMS  Objective: Current vital signs: BP 148/61  Pulse 69  Temp(Src) 98.7 F (37.1 C) (Axillary)  Resp 20  Ht 5\' 3"  (1.6 m)  Wt 68.675 kg (151 lb 6.4 oz)  BMI 26.83 kg/m2  SpO2 100% Vital signs in last 24 hours: Temp:  [97.9 F (36.6 C)-100.6 F (38.1 C)] 98.7 F (37.1 C) (08/21 0958) Pulse Rate:  [59-71] 69 (08/21 1011) Resp:  [16-26] 20 (08/21 0958) BP: (128-175)/(35-76) 148/61 mmHg (08/21 0958) SpO2:  [92 %-100 %] 100 % (08/21 0958) Weight:  [68.675 kg (151 lb 6.4 oz)] 68.675 kg (151 lb 6.4 oz) (08/20 2149)  Intake/Output from previous day:   Intake/Output this shift:   Nutritional status: Cardiac  Neurologic Exam: General: NAD Mental Status: Alert, oriented, thought content appropriate.  Speech fluent without evidence of aphasia.  Able to follow 3 step commands without difficulty. Cranial Nerves: II: Discs flat bilaterally; Visual fields grossly normal, pupils equal, round, reactive to light and accommodation III,IV, VI: ptosis not present, extra-ocular motions intact bilaterally V,VII: smile symmetric, facial light touch sensation normal bilaterally VIII: hearing normal bilaterally IX,X: gag reflex present XI: bilateral shoulder shrug XII: midline tongue extension without atrophy or fasciculations  Motor: Right : Upper extremity   5/5    Left:     Upper extremity   5/5  Lower extremity   5/5     Lower extremity   5/5 Tone and bulk:normal tone throughout; no atrophy noted Sensory: Pinprick and light touch intact throughout, bilaterally Deep Tendon Reflexes:  Right: Upper Extremity   Left: Upper extremity   biceps (C-5 to C-6) 2/4   biceps (C-5 to C-6) 2/4 tricep (C7) 2/4    triceps (C7) 2/4 Brachioradialis (C6) 2/4  Brachioradialis (C6) 2/4  Lower Extremity Lower Extremity  quadriceps (L-2 to L-4) 2/4   quadriceps (L-2 to L-4) 2/4 Achilles (S1) 1/4   Achilles (S1) 1/4  Plantars: Right:  downgoing   Left: downgoing Cerebellar: normal finger-to-nose,  normal heel-to-shin test    Lab Results: Basic Metabolic Panel:  Recent Labs Lab 10/08/13 1631 10/08/13 1645 10/09/13 0549  NA 137 133* 135*  K 3.9 3.8 4.0  CL 96 96 95*  CO2 30  --  28  GLUCOSE 128* 136* 76  BUN 23 23 24*  CREATININE 1.15* 1.20* 1.05  CALCIUM 9.6  --  9.4    Liver Function Tests:  Recent Labs Lab 10/09/13 0549  AST 24  ALT 13  ALKPHOS 86  BILITOT 0.5  PROT 6.4  ALBUMIN 3.0*   No results found for this basename: LIPASE, AMYLASE,  in the last 168 hours No results found for this basename: AMMONIA,  in the last 168 hours  CBC:  Recent Labs Lab 10/08/13 1631 10/08/13 1645  WBC 9.4  --   HGB 11.6* 12.2  HCT 35.0* 36.0  MCV 85.8  --   PLT 275  --     Cardiac Enzymes:  Recent Labs Lab 10/09/13 0549  CKTOTAL 62    Lipid Panel:  Recent Labs Lab 10/09/13 0549  CHOL 129  TRIG 97  HDL 57  CHOLHDL 2.3  VLDL 19  LDLCALC 53    CBG:  Recent Labs Lab 10/08/13 2203 10/09/13 0631  GLUCAP 115* 77    Microbiology: No results found for this or any previous visit.  Coagulation Studies: No results found for this basename: LABPROT, INR,  in the last 72 hours  Imaging: Dg Chest 2 View  10/08/2013   CLINICAL DATA:  Weakness and right shoulder blade pain.  EXAM: CHEST  2 VIEW  COMPARISON:  05/23/2004  FINDINGS: Mild enlargement of the heart is unchanged. Lungs are clear without airspace disease or edema. Evidence for mild scarring at the lung apices. No acute bone abnormality.  IMPRESSION: No active cardiopulmonary disease.   Electronically Signed   By: Markus Daft M.D.   On: 10/08/2013 18:52   Mr [name] Head Wo Contrast  10/09/2013   CLINICAL DATA:  Question seizure versus transient ischemic attack. Suspected polymyalgia rheumatica. Altered mental status and lower extremity weakness.  EXAM: MRI HEAD WITHOUT CONTRAST  MRA HEAD WITHOUT CONTRAST  TECHNIQUE: Multiplanar,  multiecho pulse sequences of the brain and surrounding structures were obtained without intravenous contrast. Angiographic images of the head were obtained using MRA technique without contrast.  COMPARISON:  None.  FINDINGS: MRI HEAD FINDINGS  No evidence for acute infarction, hemorrhage, mass lesion, hydrocephalus, or extra-axial fluid. Generalized cerebral and cerebellar atrophy. Mild to moderate subcortical and periventricular T2 and FLAIR hyperintensities, likely chronic microvascular ischemic change. Flow voids are maintained. Tiny focus chronic hemorrhage RIGHT cerebellum likely incidental related to chronic hypertension. Pituitary, pineal, and cerebellar tonsils unremarkable. No upper cervical lesions. Mild pannus is noncompressive. Visualized calvarium, skull base, and upper cervical osseous structures unremarkable. Scalp and orbits, sinuses, and mastoids show no acute process. BILATERAL occiput to C1 joint effusions, greater on the LEFT  MRA HEAD FINDINGS  The internal carotid arteries are dolichoectatic but widely patent. The basilar artery is dolichoectatic with LEFT vertebral is the sole contributor. RIGHT vertebral ends in PICA. No proximal flow-limiting stenosis of the middle or posterior cerebral arteries. Dominant LEFT A1 ACA with mild irregularity of the RIGHT A1 ACA, likely insignificant. Well-developed anterior communicating artery. No cerebellar branch occlusion although the AICA vessels are poorly visualized. No intracranial aneurysm.  IMPRESSION: Atrophy with chronic microvascular ischemic change. No acute intracranial findings.  Specifically no evidence for acute stroke or restricted diffusion to suggested postictal phenomenon.  Chronic LEFT parietal infarct.  No vascular occlusion or flow-limiting stenosis.   Electronically Signed   By: Rolla Flatten M.D.   On: 10/09/2013 08:24   Mr Brain Wo Contrast  10/09/2013   CLINICAL DATA:  Question seizure versus transient ischemic attack. Suspected  polymyalgia rheumatica. Altered mental status and lower extremity weakness.  EXAM: MRI HEAD WITHOUT CONTRAST  MRA HEAD WITHOUT CONTRAST  TECHNIQUE: Multiplanar, multiecho pulse sequences of the brain and surrounding structures were obtained without intravenous contrast. Angiographic images of the head were obtained using MRA technique without contrast.  COMPARISON:  None.  FINDINGS: MRI HEAD FINDINGS  No evidence for acute infarction, hemorrhage, mass lesion, hydrocephalus, or extra-axial fluid. Generalized cerebral and cerebellar atrophy. Mild to moderate subcortical and periventricular T2 and FLAIR hyperintensities, likely chronic microvascular ischemic change. Flow voids are maintained. Tiny focus chronic hemorrhage RIGHT cerebellum likely incidental related to chronic hypertension. Pituitary, pineal, and cerebellar tonsils unremarkable. No upper cervical lesions. Mild pannus is noncompressive. Visualized calvarium, skull base, and upper cervical osseous structures unremarkable. Scalp and orbits, sinuses, and mastoids show no acute process. BILATERAL occiput to C1 joint effusions, greater on the LEFT  MRA HEAD FINDINGS  The internal carotid arteries are dolichoectatic but widely patent. The basilar artery is dolichoectatic with LEFT vertebral is the sole contributor. RIGHT vertebral ends in PICA. No proximal flow-limiting stenosis of the middle or posterior cerebral arteries. Dominant LEFT A1 ACA with mild  irregularity of the RIGHT A1 ACA, likely insignificant. Well-developed anterior communicating artery. No cerebellar branch occlusion although the AICA vessels are poorly visualized. No intracranial aneurysm.  IMPRESSION: Atrophy with chronic microvascular ischemic change. No acute intracranial findings.  Specifically no evidence for acute stroke or restricted diffusion to suggested postictal phenomenon.  Chronic LEFT parietal infarct.  No vascular occlusion or flow-limiting stenosis.   Electronically Signed    By: Rolla Flatten M.D.   On: 10/09/2013 08:24   Carotid doppler pending A1C pending LDL 53   Medications:  Scheduled: . [START ON 10/10/2013] allopurinol  300 mg Oral Once per day on Sun Tue Thu Sat  . atorvastatin  10 mg Oral q1800  . beta carotene w/minerals  1 tablet Oral Daily  . cholecalciferol  1,000 Units Oral QHS  . enoxaparin (LOVENOX) injection  40 mg Subcutaneous Q24H  . insulin aspart  0-9 Units Subcutaneous TID WC  . irbesartan  300 mg Oral Daily  . labetalol  100 mg Oral BID  . loratadine  10 mg Oral QHS  . omega-3 acid ethyl esters  2 g Oral Daily  . predniSONE  15 mg Oral Q breakfast    Assessment/Plan: 80 year old female with recurrent episodes of altered mental status and lower extremity weakness. While in hospital she has had no further episodes.  MRI brain showed no acute infarct or significant large vessel stenosis. LDL 53. EEG and carotid doppler are pending. Will continue to follow patient with you and follow EEG results.         Etta Quill PA-C [facility] [phone]  10/09/2013, 10:37 AM

## 2013-10-09 NOTE — Discharge Summary (Addendum)
,      Physician Discharge Summary  Sherri Long MRN: [id number] DOB/AGE: Sep 09, 1930 80 y.o.  PCP: No primary provider on file.   Admit date: 10/08/2013 Discharge date: 10/09/2013  Discharge Diagnoses:  Suspected polymyalgia rheumatica Transient altered mental status-probable TIA Previous left parietal infarct   Weakness secondary to polymyalgia rheumatica Active Problems:   Diabetes mellitus type 2, controlled   HTN (hypertension)   HLD (hyperlipidemia)   Gout   Followup recommendations Recommend outpatient rheumatology evaluation for possible PMR PCP in 1 week Monitor CBC bimonthly     Medication List         allopurinol 300 MG tablet  Commonly known as:  ZYLOPRIM  Take 300 mg by mouth 4 (four) times a week.     beta carotene w/minerals tablet  Take 1 tablet by mouth daily.     Biotin 5 MG Tabs  Take 5 mg by mouth every morning.     cholecalciferol 1000 UNITS tablet  Commonly known as:  VITAMIN D  Take 1,000 Units by mouth at bedtime.     Chromium 200 MCG Tabs  Take 200 mcg by mouth daily.     clopidogrel 75 MG tablet  Commonly known as:  PLAVIX  Take 1 tablet (75 mg total) by mouth daily.     FISH OIL PLUS CO Q-10 1000-30 MG Caps  Take 2 capsules by mouth daily.     labetalol 100 MG tablet  Commonly known as:  NORMODYNE  Take 100 mg by mouth 2 (two) times daily.     loratadine 10 MG tablet  Commonly known as:  CLARITIN  Take 10 mg by mouth at bedtime.     metFORMIN 500 MG (MOD) 24 hr tablet  Commonly known as:  GLUMETZA  Take 500 mg by mouth at bedtime.     OVER THE COUNTER MEDICATION  Apply 1 application topically daily as needed (for pain).     pantoprazole 40 MG tablet  Commonly known as:  PROTONIX  Take 1 tablet (40 mg total) by mouth daily.     predniSONE 5 MG tablet  Commonly known as:  DELTASONE  Take 3 tablets (15 mg total) by mouth daily with breakfast.     rosuvastatin 5 MG tablet  Commonly known as:  CRESTOR  Take 2.5 mg  by mouth 4 (four) times a week. In PM     SALONPAS PAIN RELIEF PATCH Pads  Apply 1 each topically daily as needed (for pain).     traMADol 50 MG tablet  Commonly known as:  ULTRAM  Take 50 mg by mouth every 12 (twelve) hours as needed for moderate pain.     valsartan-hydrochlorothiazide 320-25 MG per tablet  Commonly known as:  DIOVAN-HCT  Take 1 tablet by mouth daily.        Discharge Condition:  Disposition:    Consults: Neurology  Significant Diagnostic Studies: Dg Chest 2 View  10/08/2013   CLINICAL DATA:  Weakness and right shoulder blade pain.  EXAM: CHEST  2 VIEW  COMPARISON:  05/23/2004  FINDINGS: Mild enlargement of the heart is unchanged. Lungs are clear without airspace disease or edema. Evidence for mild scarring at the lung apices. No acute bone abnormality.  IMPRESSION: No active cardiopulmonary disease.   Electronically Signed   By: Markus Daft M.D.   On: 10/08/2013 18:52   Mr Sherri Long Head Wo Contrast  10/09/2013   CLINICAL DATA:  Question seizure versus transient ischemic attack. Suspected polymyalgia rheumatica. Altered  mental status and lower extremity weakness.  EXAM: MRI HEAD WITHOUT CONTRAST  MRA HEAD WITHOUT CONTRAST  TECHNIQUE: Multiplanar, multiecho pulse sequences of the brain and surrounding structures were obtained without intravenous contrast. Angiographic images of the head were obtained using MRA technique without contrast.  COMPARISON:  None.  FINDINGS: MRI HEAD FINDINGS  No evidence for acute infarction, hemorrhage, mass lesion, hydrocephalus, or extra-axial fluid. Generalized cerebral and cerebellar atrophy. Mild to moderate subcortical and periventricular T2 and FLAIR hyperintensities, likely chronic microvascular ischemic change. Flow voids are maintained. Tiny focus chronic hemorrhage RIGHT cerebellum likely incidental related to chronic hypertension. Pituitary, pineal, and cerebellar tonsils unremarkable. No upper cervical lesions. Mild pannus is  noncompressive. Visualized calvarium, skull base, and upper cervical osseous structures unremarkable. Scalp and orbits, sinuses, and mastoids show no acute process. BILATERAL occiput to C1 joint effusions, greater on the LEFT  MRA HEAD FINDINGS  The internal carotid arteries are dolichoectatic but widely patent. The basilar artery is dolichoectatic with LEFT vertebral is the sole contributor. RIGHT vertebral ends in PICA. No proximal flow-limiting stenosis of the middle or posterior cerebral arteries. Dominant LEFT A1 ACA with mild irregularity of the RIGHT A1 ACA, likely insignificant. Well-developed anterior communicating artery. No cerebellar branch occlusion although the AICA vessels are poorly visualized. No intracranial aneurysm.  IMPRESSION: Atrophy with chronic microvascular ischemic change. No acute intracranial findings.  Specifically no evidence for acute stroke or restricted diffusion to suggested postictal phenomenon.  Chronic LEFT parietal infarct.  No vascular occlusion or flow-limiting stenosis.   Electronically Signed   By: Rolla Flatten M.D.   On: 10/09/2013 08:24   Mr Brain Wo Contrast  10/09/2013   CLINICAL DATA:  Question seizure versus transient ischemic attack. Suspected polymyalgia rheumatica. Altered mental status and lower extremity weakness.  EXAM: MRI HEAD WITHOUT CONTRAST  MRA HEAD WITHOUT CONTRAST  TECHNIQUE: Multiplanar, multiecho pulse sequences of the brain and surrounding structures were obtained without intravenous contrast. Angiographic images of the head were obtained using MRA technique without contrast.  COMPARISON:  None.  FINDINGS: MRI HEAD FINDINGS  No evidence for acute infarction, hemorrhage, mass lesion, hydrocephalus, or extra-axial fluid. Generalized cerebral and cerebellar atrophy. Mild to moderate subcortical and periventricular T2 and FLAIR hyperintensities, likely chronic microvascular ischemic change. Flow voids are maintained. Tiny focus chronic hemorrhage RIGHT  cerebellum likely incidental related to chronic hypertension. Pituitary, pineal, and cerebellar tonsils unremarkable. No upper cervical lesions. Mild pannus is noncompressive. Visualized calvarium, skull base, and upper cervical osseous structures unremarkable. Scalp and orbits, sinuses, and mastoids show no acute process. BILATERAL occiput to C1 joint effusions, greater on the LEFT  MRA HEAD FINDINGS  The internal carotid arteries are dolichoectatic but widely patent. The basilar artery is dolichoectatic with LEFT vertebral is the sole contributor. RIGHT vertebral ends in PICA. No proximal flow-limiting stenosis of the middle or posterior cerebral arteries. Dominant LEFT A1 ACA with mild irregularity of the RIGHT A1 ACA, likely insignificant. Well-developed anterior communicating artery. No cerebellar branch occlusion although the AICA vessels are poorly visualized. No intracranial aneurysm.  IMPRESSION: Atrophy with chronic microvascular ischemic change. No acute intracranial findings.  Specifically no evidence for acute stroke or restricted diffusion to suggested postictal phenomenon.  Chronic LEFT parietal infarct.  No vascular occlusion or flow-limiting stenosis.   Electronically Signed   By: Rolla Flatten M.D.   On: 10/09/2013 08:24       Microbiology: No results found for this or any previous visit (from the past 240 hour(s)).  Labs: Results for orders placed during the hospital encounter of 10/08/13 (from the past 48 hour(s))  BASIC METABOLIC PANEL     Status: Abnormal   Collection Time    10/08/13  4:31 PM      Result Value Ref Range   Sodium 137  137 - 147 mEq/L   Potassium 3.9  3.7 - 5.3 mEq/L   Chloride 96  96 - 112 mEq/L   CO2 30  19 - 32 mEq/L   Glucose, Bld 128 (*) 70 - 99 mg/dL   BUN 23  6 - 23 mg/dL   Creatinine, Ser 1.15 (*) 0.50 - 1.10 mg/dL   Calcium 9.6  8.4 - 10.5 mg/dL   GFR calc non Af Amer 43 (*) >90 mL/min   GFR calc Af Amer 50 (*) >90 mL/min   Comment: (NOTE)      The eGFR has been calculated using the CKD EPI equation.     This calculation has not been validated in all clinical situations.     eGFR's persistently <90 mL/min signify possible Chronic Kidney     Disease.   Anion gap 11  5 - 15  CBC     Status: Abnormal   Collection Time    10/08/13  4:31 PM      Result Value Ref Range   WBC 9.4  4.0 - 10.5 K/uL   RBC 4.08  3.87 - 5.11 MIL/uL   Hemoglobin 11.6 (*) 12.0 - 15.0 g/dL   HCT 35.0 (*) 36.0 - 46.0 %   MCV 85.8  78.0 - 100.0 fL   MCH 28.4  26.0 - 34.0 pg   MCHC 33.1  30.0 - 36.0 g/dL   RDW 12.7  11.5 - 15.5 %   Platelets 275  150 - 400 K/uL  I-STAT TROPOININ, ED     Status: None   Collection Time    10/08/13  4:43 PM      Result Value Ref Range   Troponin i, poc 0.00  0.00 - 0.08 ng/mL   Comment 3            Comment: Due to the release kinetics of cTnI,     a negative result within the first hours     of the onset of symptoms does not rule out     myocardial infarction with certainty.     If myocardial infarction is still suspected,     repeat the test at appropriate intervals.  I-STAT CHEM 8, ED     Status: Abnormal   Collection Time    10/08/13  4:45 PM      Result Value Ref Range   Sodium 133 (*) 137 - 147 mEq/L   Potassium 3.8  3.7 - 5.3 mEq/L   Chloride 96  96 - 112 mEq/L   BUN 23  6 - 23 mg/dL   Creatinine, Ser 1.20 (*) 0.50 - 1.10 mg/dL   Glucose, Bld 136 (*) 70 - 99 mg/dL   Calcium, Ion 1.17  1.13 - 1.30 mmol/L   TCO2 29  0 - 100 mmol/L   Hemoglobin 12.2  12.0 - 15.0 g/dL   HCT 36.0  36.0 - 46.0 %  SEDIMENTATION RATE     Status: Abnormal   Collection Time    10/08/13  6:21 PM      Result Value Ref Range   Sed Rate 72 (*) 0 - 22 mm/hr  C-REACTIVE PROTEIN     Status: Abnormal   Collection  Time    10/08/13  6:21 PM      Result Value Ref Range   CRP 3.0 (*) <0.60 mg/dL   Comment: Performed at [facility]     Status: None   Collection Time    10/08/13  6:35 PM       Result Value Ref Range   Color, Urine YELLOW  YELLOW   APPearance CLEAR  CLEAR   Specific Gravity, Urine 1.010  1.005 - 1.030   pH 6.0  5.0 - 8.0   Glucose, UA NEGATIVE  NEGATIVE mg/dL   Hgb urine dipstick NEGATIVE  NEGATIVE   Bilirubin Urine NEGATIVE  NEGATIVE   Ketones, ur NEGATIVE  NEGATIVE mg/dL   Protein, ur NEGATIVE  NEGATIVE mg/dL   Urobilinogen, UA 0.2  0.0 - 1.0 mg/dL   Nitrite NEGATIVE  NEGATIVE   Leukocytes, UA NEGATIVE  NEGATIVE   Comment: MICROSCOPIC NOT DONE ON URINES WITH NEGATIVE PROTEIN, BLOOD, LEUKOCYTES, NITRITE, OR GLUCOSE <1000 mg/dL.  URINE RAPID DRUG SCREEN (HOSP PERFORMED)     Status: None   Collection Time    10/08/13  6:35 PM      Result Value Ref Range   Opiates NONE DETECTED  NONE DETECTED   Cocaine NONE DETECTED  NONE DETECTED   Benzodiazepines NONE DETECTED  NONE DETECTED   Amphetamines NONE DETECTED  NONE DETECTED   Tetrahydrocannabinol NONE DETECTED  NONE DETECTED   Barbiturates NONE DETECTED  NONE DETECTED   Comment:            DRUG SCREEN FOR MEDICAL PURPOSES     ONLY.  IF CONFIRMATION IS NEEDED     FOR ANY PURPOSE, NOTIFY LAB     WITHIN 5 DAYS.                LOWEST DETECTABLE LIMITS     FOR URINE DRUG SCREEN     Drug Class       Cutoff (ng/mL)     Amphetamine      1000     Barbiturate      200     Benzodiazepine   063     Tricyclics       016     Opiates          300     Cocaine          300     THC              50  GLUCOSE, CAPILLARY     Status: Abnormal   Collection Time    10/08/13 10:03 PM      Result Value Ref Range   Glucose-Capillary 115 (*) 70 - 99 mg/dL   Comment 1 Notify RN     Comment 2 Documented in Chart    TSH     Status: None   Collection Time    10/09/13  5:00 AM      Result Value Ref Range   TSH 2.860  0.350 - 4.500 uIU/mL  COMPREHENSIVE METABOLIC PANEL     Status: Abnormal   Collection Time    10/09/13  5:49 AM      Result Value Ref Range   Sodium 135 (*) 137 - 147 mEq/L   Potassium 4.0  3.7 - 5.3  mEq/L   Chloride 95 (*) 96 - 112 mEq/L   CO2 28  19 - 32 mEq/L   Glucose, Bld 76  70 - 99  mg/dL   BUN 24 (*) 6 - 23 mg/dL   Creatinine, Ser 1.05  0.50 - 1.10 mg/dL   Calcium 9.4  8.4 - 10.5 mg/dL   Total Protein 6.4  6.0 - 8.3 g/dL   Albumin 3.0 (*) 3.5 - 5.2 g/dL   AST 24  0 - 37 U/L   ALT 13  0 - 35 U/L   Alkaline Phosphatase 86  39 - 117 U/L   Total Bilirubin 0.5  0.3 - 1.2 mg/dL   GFR calc non Af Amer 48 (*) >90 mL/min   GFR calc Af Amer 56 (*) >90 mL/min   Comment: (NOTE)     The eGFR has been calculated using the CKD EPI equation.     This calculation has not been validated in all clinical situations.     eGFR's persistently <90 mL/min signify possible Chronic Kidney     Disease.   Anion gap 12  5 - 15  CK     Status: None   Collection Time    10/09/13  5:49 AM      Result Value Ref Range   Total CK 62  7 - 177 U/L  HEMOGLOBIN A1C     Status: Abnormal   Collection Time    10/09/13  5:49 AM      Result Value Ref Range   Hemoglobin A1C 6.5 (*) <5.7 %   Comment: (NOTE)                                                                               According to the ADA Clinical Practice Recommendations for 2011, when     HbA1c is used as a screening test:      >=6.5%   Diagnostic of Diabetes Mellitus               (if abnormal result is confirmed)     5.7-6.4%   Increased risk of developing Diabetes Mellitus     References:Diagnosis and Classification of Diabetes Mellitus,Diabetes     OIZT,2458,09(XIPJA 1):S62-S69 and Standards of Medical Care in             Diabetes - 2011,Diabetes Care,2011,34 (Suppl 1):S11-S61.   Mean Plasma Glucose 140 (*) <117 mg/dL   Comment: Performed at [facility]     Status: None   Collection Time    10/09/13  5:49 AM      Result Value Ref Range   Cholesterol 129  0 - 200 mg/dL   Triglycerides 97  <150 mg/dL   HDL 57  >39 mg/dL   Total CHOL/HDL Ratio 2.3     VLDL 19  0 - 40 mg/dL   LDL Cholesterol 53  0 - 99 mg/dL    Comment:            Total Cholesterol/HDL:CHD Risk     Coronary Heart Disease Risk Table                         Men   Women      1/2 Average Risk   3.4   3.3      Average Risk  5.0   4.4      2 X Average Risk   9.6   7.1      3 X Average Risk  23.4   11.0                Use the calculated Patient Ratio     above and the CHD Risk Table     to determine the patient's CHD Risk.                ATP III CLASSIFICATION (LDL):      <100     mg/dL   Optimal      100-129  mg/dL   Near or Above                        Optimal      130-159  mg/dL   Borderline      160-189  mg/dL   High      >190     mg/dL   Very High  VITAMIN B12     Status: None   Collection Time    10/09/13  5:49 AM      Result Value Ref Range   Vitamin B-12 792  211 - 911 pg/mL   Comment: Performed at [facility]     Status: None   Collection Time    10/09/13  5:49 AM      Result Value Ref Range   Folate >20.0     Comment: (NOTE)     Reference Ranges            Deficient:       0.4 - 3.3 ng/mL            Indeterminate:   3.4 - 5.4 ng/mL            Normal:              > 5.4 ng/mL     Performed at [facility]     Status: Abnormal   Collection Time    10/09/13  5:49 AM      Result Value Ref Range   Iron 38 (*) 42 - 135 ug/dL   TIBC 325  250 - 470 ug/dL   Saturation Ratios 12 (*) 20 - 55 %   UIBC 287  125 - 400 ug/dL   Comment: Performed at [facility]     Status: None   Collection Time    10/09/13  5:49 AM      Result Value Ref Range   Ferritin 76  10 - 291 ng/mL   Comment: Performed at [facility]     Status: None   Collection Time    10/09/13  5:49 AM      Result Value Ref Range   Retic Ct Pct 1.2  0.4 - 3.1 %   RBC. 3.95  3.87 - 5.11 MIL/uL   Retic Count, Manual 47.4  19.0 - 186.0 K/uL  GLUCOSE, CAPILLARY     Status: None   Collection Time    10/09/13  6:31 AM      Result Value Ref Range   Glucose-Capillary  77  70 - 99 mg/dL   Comment 1 Notify RN     Comment 2 Documented in Chart    GLUCOSE, CAPILLARY     Status: Abnormal   Collection Time    10/09/13  11:32 AM      Result Value Ref Range   Glucose-Capillary 135 (*) 70 - 99 mg/dL   Comment 1 Notify RN       HPI  80 y.o. female with history of diabetes mellitus, hypertension, hyperlipidemia and gout was brought to the ER because patient has been having some lower extremity weakness. As per patient and patient's daughter yesterday around 1 PM while patient was in the car with her daughter patient was not responding to patient's daughter's questions. Patient states that she was aware that she has been questioned but she thought he need not have to onset them. When they reached home patient felt weak to get out of the car because she was not able to move her lower extremities well. She also had a small jerking spell. She did not lose consciousness or have any incontinence of urine or tongue bite. Patient's daughter went to get some help and after coming back patient was able to stand and walk by herself. Since then patient has been having some weakness of the lower extremity with transient episodes of jerking. Patient also had some shoulder pain recently which improved with prednisone taper. In the ER patient was found to be nonfocal. CT of the head was negative for anything acute. Sedimentation rate was found to be elevated. On-call neurologist was consulted and patient has been admitted for further workup. Patient denies any chest pain shortness of breath nausea vomiting visual symptoms or any difficulty swallowing. Has not lost function of the upper extremities.   HOSPITAL COURSE:  Recurrent episodes of altered mental status and lower extremity weakness Possible etiologies include presyncope in the setting of an inflammatory state, partial seizures. TIA would be a possibility, though we  feel is less likely given the bilateral nature of her leg  weakness.  With the symptoms of progressive upper extremity myalgias, elevated ESR, rapid and complete response to prednisone, suspect polymyalgia rheumatica. She has no headache or other signs of temporal artery involvement. As per neurology recommendations the following tests were done 1) MRI/MRA brain Atrophy with chronic microvascular ischemic change. No acute  intracranial findings.  Specifically no evidence for acute stroke or restricted diffusion to  suggested postictal phenomenon.  Chronic LEFT parietal infarct.  No vascular occlusion or flow-limiting stenosis  Carotid Doppler Bilateral: 1-39% ICA stenosis  EEG  is  normal EEG recording. No evidence of an epileptic disorder was demonstrated. There is also no evidence of an encephalopathic disorder.  ECHO No cardiac source of embolism was identified, LV EF: 60%  Started patient on plavix given risk factors and possibility of a TIA and old infarcts on MRI   A1c   6.5, LDL 53, HDL 57 evaluated by neurology prior to discharge,  neurology recommended restarting her prednisone given suspected polymyalgia rheumatica, started on a dose of 15 mg of prednisone a day, explained the side effects patient agreeable Will need outpatient rheumatology consultation       Diabetes mellitus - closely follow CBGs as patient is likely to be started on prednisone , currently on metformin, hemoglobin A1c 6.5  Hypertension - continue outpatient medications, low-sodium of 135, monitor BMP as the patient is on hydrochlorothiazide   Hyperlipidemia - on statins.   Mild Anemia - normal anemia panel. Will need frequent monitoring of her CBC   Mildly febrile - may be from #1. Chest x-ray and UA are unremarkable.   Gout - continue home medications.    Discharge Exam: * Blood pressure 148/61,  pulse 69, temperature 98.7 F (37.1 C), temperature source Axillary, resp. rate 20, height '5\' 3"'  (1.6 m), weight 68.675 kg (151 lb 6.4 oz), SpO2  100.00%.  General: Well-developed and nourished.  Eyes: Anicteric no pallor.  ENT: No discharge from the ears eyes nose mouth.  Neck: No mass felt. No neck rigidity.  Cardiovascular: S1-S2 heard.  Respiratory: No rhonchi or crepitations.  Abdomen: Soft nontender bowel sounds present. No guarding or rigidity.  Skin: No rash.  Musculoskeletal: No edema. No muscle tenderness or effusions in the joints.  Psychiatric: Appears normal.  Neurologic: Alert awake oriented to time place and person. Moves all extremities 5 x 5. No facial asymmetry. Tongue is midline.        Discharge Instructions   Diet - low sodium heart healthy    Complete by:  As directed      Increase activity slowly    Complete by:  As directed            Follow-up Information   Follow up with PCP. Schedule an appointment as soon as possible for a visit in 1 week. (hospital followup)       Follow up with Rheumatology. Schedule an appointment as soon as possible for a visit in 2 weeks. (For possible polymyalgia rheumatica)       Signed: Reyne Dumas 10/09/2013, 12:30 PM

## 2013-10-09 NOTE — Progress Notes (Signed)
  Echocardiogram 2D Echocardiogram has been performed.  Sherri Long 10/09/2013, 2:15 PM

## 2013-10-09 NOTE — Progress Notes (Signed)
*  PRELIMINARY RESULTS* Vascular Ultrasound Carotid Duplex (Doppler) has been completed.  Preliminary findings: Bilateral:  1-39% ICA stenosis.  Vertebral artery flow is antegrade.      Landry Mellow, RDMS, RVT  10/09/2013, 1:54 PM

## 2013-10-15 NOTE — Progress Notes (Signed)
Late Entry Addendum for Initial Evaluation   10/09/13 1316  PT Time Calculation  PT Start Time 1255  PT Stop Time 1316  PT Time Calculation (min) 21 min  PT G-Codes **NOT FOR INPATIENT CLASS**  Functional Assessment Tool Used clinical judgement  Functional Limitation Mobility: Walking and moving around  Mobility: Walking and Moving Around Current Status (S3419) CI  Mobility: Walking and Moving Around Goal Status (Q2229) CI  Mobility: Walking and Moving Around Discharge Status (N9892) CI  PT General Charges  $$ ACUTE PT VISIT 1 Procedure  PT Evaluation  $Initial PT Evaluation Tier I 1 Procedure  PT Treatments  $Gait Training 8-22 mins   10/15/2013  Donnella Sham, PT [phone]  (pager)

## 2014-01-11 ENCOUNTER — Other Ambulatory Visit: Payer: Self-pay | Admitting: Internal Medicine

## 2014-02-23 DIAGNOSIS — J45909 Unspecified asthma, uncomplicated: Secondary | ICD-10-CM | POA: Diagnosis not present

## 2014-02-23 DIAGNOSIS — I471 Supraventricular tachycardia: Secondary | ICD-10-CM | POA: Diagnosis not present

## 2014-02-23 DIAGNOSIS — E1159 Type 2 diabetes mellitus with other circulatory complications: Secondary | ICD-10-CM | POA: Diagnosis not present

## 2014-02-23 DIAGNOSIS — I10 Essential (primary) hypertension: Secondary | ICD-10-CM | POA: Diagnosis not present

## 2014-02-23 DIAGNOSIS — E559 Vitamin D deficiency, unspecified: Secondary | ICD-10-CM | POA: Diagnosis not present

## 2014-02-23 DIAGNOSIS — E079 Disorder of thyroid, unspecified: Secondary | ICD-10-CM | POA: Diagnosis not present

## 2014-02-23 DIAGNOSIS — M1990 Unspecified osteoarthritis, unspecified site: Secondary | ICD-10-CM | POA: Diagnosis not present

## 2014-02-23 DIAGNOSIS — E785 Hyperlipidemia, unspecified: Secondary | ICD-10-CM | POA: Diagnosis not present

## 2014-02-26 DIAGNOSIS — M25562 Pain in left knee: Secondary | ICD-10-CM | POA: Diagnosis not present

## 2014-02-26 DIAGNOSIS — M1712 Unilateral primary osteoarthritis, left knee: Secondary | ICD-10-CM | POA: Diagnosis not present

## 2014-03-14 ENCOUNTER — Other Ambulatory Visit: Payer: Self-pay | Admitting: Internal Medicine

## 2014-04-01 DIAGNOSIS — M25511 Pain in right shoulder: Secondary | ICD-10-CM | POA: Diagnosis not present

## 2014-04-01 DIAGNOSIS — R609 Edema, unspecified: Secondary | ICD-10-CM | POA: Diagnosis not present

## 2014-04-01 DIAGNOSIS — Z6828 Body mass index (BMI) 28.0-28.9, adult: Secondary | ICD-10-CM | POA: Diagnosis not present

## 2014-04-01 DIAGNOSIS — R700 Elevated erythrocyte sedimentation rate: Secondary | ICD-10-CM | POA: Diagnosis not present

## 2014-04-07 DIAGNOSIS — M25519 Pain in unspecified shoulder: Secondary | ICD-10-CM | POA: Diagnosis not present

## 2014-04-07 DIAGNOSIS — M19012 Primary osteoarthritis, left shoulder: Secondary | ICD-10-CM | POA: Diagnosis not present

## 2014-04-12 DIAGNOSIS — I2510 Atherosclerotic heart disease of native coronary artery without angina pectoris: Secondary | ICD-10-CM | POA: Diagnosis not present

## 2014-04-12 DIAGNOSIS — E780 Pure hypercholesterolemia: Secondary | ICD-10-CM | POA: Diagnosis not present

## 2014-04-12 DIAGNOSIS — R030 Elevated blood-pressure reading, without diagnosis of hypertension: Secondary | ICD-10-CM | POA: Diagnosis not present

## 2014-04-16 DIAGNOSIS — M13872 Other specified arthritis, left ankle and foot: Secondary | ICD-10-CM | POA: Diagnosis not present

## 2014-04-16 DIAGNOSIS — E119 Type 2 diabetes mellitus without complications: Secondary | ICD-10-CM | POA: Diagnosis not present

## 2014-04-16 DIAGNOSIS — S93492A Sprain of other ligament of left ankle, initial encounter: Secondary | ICD-10-CM | POA: Diagnosis not present

## 2014-04-21 DIAGNOSIS — M25562 Pain in left knee: Secondary | ICD-10-CM | POA: Diagnosis not present

## 2014-05-03 DIAGNOSIS — M1712 Unilateral primary osteoarthritis, left knee: Secondary | ICD-10-CM | POA: Diagnosis not present

## 2014-05-03 DIAGNOSIS — M25562 Pain in left knee: Secondary | ICD-10-CM | POA: Diagnosis not present

## 2014-05-03 DIAGNOSIS — M25572 Pain in left ankle and joints of left foot: Secondary | ICD-10-CM | POA: Diagnosis not present

## 2014-05-11 DIAGNOSIS — M25562 Pain in left knee: Secondary | ICD-10-CM | POA: Diagnosis not present

## 2014-05-11 DIAGNOSIS — M1712 Unilateral primary osteoarthritis, left knee: Secondary | ICD-10-CM | POA: Diagnosis not present

## 2014-05-11 DIAGNOSIS — M25572 Pain in left ankle and joints of left foot: Secondary | ICD-10-CM | POA: Diagnosis not present

## 2014-05-13 DIAGNOSIS — M25572 Pain in left ankle and joints of left foot: Secondary | ICD-10-CM | POA: Diagnosis not present

## 2014-05-13 DIAGNOSIS — M1712 Unilateral primary osteoarthritis, left knee: Secondary | ICD-10-CM | POA: Diagnosis not present

## 2014-05-13 DIAGNOSIS — M25562 Pain in left knee: Secondary | ICD-10-CM | POA: Diagnosis not present

## 2014-05-17 DIAGNOSIS — M25562 Pain in left knee: Secondary | ICD-10-CM | POA: Diagnosis not present

## 2014-05-17 DIAGNOSIS — M25572 Pain in left ankle and joints of left foot: Secondary | ICD-10-CM | POA: Diagnosis not present

## 2014-05-17 DIAGNOSIS — M1712 Unilateral primary osteoarthritis, left knee: Secondary | ICD-10-CM | POA: Diagnosis not present

## 2014-05-20 DIAGNOSIS — M1712 Unilateral primary osteoarthritis, left knee: Secondary | ICD-10-CM | POA: Diagnosis not present

## 2014-05-20 DIAGNOSIS — M25562 Pain in left knee: Secondary | ICD-10-CM | POA: Diagnosis not present

## 2014-05-20 DIAGNOSIS — M25572 Pain in left ankle and joints of left foot: Secondary | ICD-10-CM | POA: Diagnosis not present

## 2014-05-26 DIAGNOSIS — M25562 Pain in left knee: Secondary | ICD-10-CM | POA: Diagnosis not present

## 2014-05-26 DIAGNOSIS — M1712 Unilateral primary osteoarthritis, left knee: Secondary | ICD-10-CM | POA: Diagnosis not present

## 2014-05-26 DIAGNOSIS — M25572 Pain in left ankle and joints of left foot: Secondary | ICD-10-CM | POA: Diagnosis not present

## 2014-06-02 DIAGNOSIS — M1712 Unilateral primary osteoarthritis, left knee: Secondary | ICD-10-CM | POA: Diagnosis not present

## 2014-06-02 DIAGNOSIS — M25562 Pain in left knee: Secondary | ICD-10-CM | POA: Diagnosis not present

## 2014-06-02 DIAGNOSIS — M25572 Pain in left ankle and joints of left foot: Secondary | ICD-10-CM | POA: Diagnosis not present

## 2014-06-08 DIAGNOSIS — I10 Essential (primary) hypertension: Secondary | ICD-10-CM | POA: Diagnosis not present

## 2014-06-08 DIAGNOSIS — Z23 Encounter for immunization: Secondary | ICD-10-CM | POA: Diagnosis not present

## 2014-06-08 DIAGNOSIS — E1159 Type 2 diabetes mellitus with other circulatory complications: Secondary | ICD-10-CM | POA: Diagnosis not present

## 2014-06-08 DIAGNOSIS — Z1212 Encounter for screening for malignant neoplasm of rectum: Secondary | ICD-10-CM | POA: Diagnosis not present

## 2014-06-08 DIAGNOSIS — E785 Hyperlipidemia, unspecified: Secondary | ICD-10-CM | POA: Diagnosis not present

## 2014-06-08 DIAGNOSIS — E1122 Type 2 diabetes mellitus with diabetic chronic kidney disease: Secondary | ICD-10-CM | POA: Diagnosis not present

## 2014-06-08 DIAGNOSIS — Z0000 Encounter for general adult medical examination without abnormal findings: Secondary | ICD-10-CM | POA: Diagnosis not present

## 2014-06-08 DIAGNOSIS — Z1389 Encounter for screening for other disorder: Secondary | ICD-10-CM | POA: Diagnosis not present

## 2014-06-08 DIAGNOSIS — Z9181 History of falling: Secondary | ICD-10-CM | POA: Diagnosis not present

## 2014-06-29 DIAGNOSIS — M79675 Pain in left toe(s): Secondary | ICD-10-CM | POA: Diagnosis not present

## 2014-06-29 DIAGNOSIS — L03119 Cellulitis of unspecified part of limb: Secondary | ICD-10-CM | POA: Diagnosis not present

## 2014-06-29 DIAGNOSIS — M109 Gout, unspecified: Secondary | ICD-10-CM | POA: Diagnosis not present

## 2014-06-29 DIAGNOSIS — Z6826 Body mass index (BMI) 26.0-26.9, adult: Secondary | ICD-10-CM | POA: Diagnosis not present

## 2014-06-29 DIAGNOSIS — I10 Essential (primary) hypertension: Secondary | ICD-10-CM | POA: Diagnosis not present

## 2014-06-29 DIAGNOSIS — E1159 Type 2 diabetes mellitus with other circulatory complications: Secondary | ICD-10-CM | POA: Diagnosis not present

## 2014-07-05 DIAGNOSIS — L570 Actinic keratosis: Secondary | ICD-10-CM | POA: Diagnosis not present

## 2014-07-05 DIAGNOSIS — L821 Other seborrheic keratosis: Secondary | ICD-10-CM | POA: Diagnosis not present

## 2014-07-05 DIAGNOSIS — L578 Other skin changes due to chronic exposure to nonionizing radiation: Secondary | ICD-10-CM | POA: Diagnosis not present

## 2014-07-05 DIAGNOSIS — C44319 Basal cell carcinoma of skin of other parts of face: Secondary | ICD-10-CM | POA: Diagnosis not present

## 2014-07-05 DIAGNOSIS — Z1231 Encounter for screening mammogram for malignant neoplasm of breast: Secondary | ICD-10-CM | POA: Diagnosis not present

## 2014-08-31 DIAGNOSIS — E119 Type 2 diabetes mellitus without complications: Secondary | ICD-10-CM | POA: Diagnosis not present

## 2014-09-02 DIAGNOSIS — L820 Inflamed seborrheic keratosis: Secondary | ICD-10-CM | POA: Diagnosis not present

## 2014-09-02 DIAGNOSIS — C44319 Basal cell carcinoma of skin of other parts of face: Secondary | ICD-10-CM | POA: Diagnosis not present

## 2014-09-02 DIAGNOSIS — I8310 Varicose veins of unspecified lower extremity with inflammation: Secondary | ICD-10-CM | POA: Diagnosis not present

## 2014-09-02 DIAGNOSIS — R600 Localized edema: Secondary | ICD-10-CM | POA: Diagnosis not present

## 2014-09-02 DIAGNOSIS — L300 Nummular dermatitis: Secondary | ICD-10-CM | POA: Diagnosis not present

## 2014-10-12 DIAGNOSIS — E1159 Type 2 diabetes mellitus with other circulatory complications: Secondary | ICD-10-CM | POA: Diagnosis not present

## 2014-10-12 DIAGNOSIS — Z139 Encounter for screening, unspecified: Secondary | ICD-10-CM | POA: Diagnosis not present

## 2014-10-12 DIAGNOSIS — Z1389 Encounter for screening for other disorder: Secondary | ICD-10-CM | POA: Diagnosis not present

## 2014-10-12 DIAGNOSIS — G459 Transient cerebral ischemic attack, unspecified: Secondary | ICD-10-CM | POA: Diagnosis not present

## 2014-10-12 DIAGNOSIS — E785 Hyperlipidemia, unspecified: Secondary | ICD-10-CM | POA: Diagnosis not present

## 2014-10-12 DIAGNOSIS — K219 Gastro-esophageal reflux disease without esophagitis: Secondary | ICD-10-CM | POA: Diagnosis not present

## 2014-10-12 DIAGNOSIS — I10 Essential (primary) hypertension: Secondary | ICD-10-CM | POA: Diagnosis not present

## 2014-10-12 DIAGNOSIS — Z9181 History of falling: Secondary | ICD-10-CM | POA: Diagnosis not present

## 2014-10-13 DIAGNOSIS — I2510 Atherosclerotic heart disease of native coronary artery without angina pectoris: Secondary | ICD-10-CM | POA: Diagnosis not present

## 2014-10-13 DIAGNOSIS — E1169 Type 2 diabetes mellitus with other specified complication: Secondary | ICD-10-CM | POA: Diagnosis not present

## 2014-10-13 DIAGNOSIS — E785 Hyperlipidemia, unspecified: Secondary | ICD-10-CM | POA: Diagnosis not present

## 2014-10-13 DIAGNOSIS — I10 Essential (primary) hypertension: Secondary | ICD-10-CM | POA: Diagnosis not present

## 2014-10-13 DIAGNOSIS — I471 Supraventricular tachycardia: Secondary | ICD-10-CM | POA: Diagnosis not present

## 2014-11-08 DIAGNOSIS — L728 Other follicular cysts of the skin and subcutaneous tissue: Secondary | ICD-10-CM | POA: Diagnosis not present

## 2014-11-08 DIAGNOSIS — L300 Nummular dermatitis: Secondary | ICD-10-CM | POA: Diagnosis not present

## 2014-11-08 DIAGNOSIS — C44319 Basal cell carcinoma of skin of other parts of face: Secondary | ICD-10-CM | POA: Diagnosis not present

## 2014-11-08 DIAGNOSIS — L821 Other seborrheic keratosis: Secondary | ICD-10-CM | POA: Diagnosis not present

## 2014-12-07 DIAGNOSIS — E1122 Type 2 diabetes mellitus with diabetic chronic kidney disease: Secondary | ICD-10-CM | POA: Diagnosis not present

## 2014-12-07 DIAGNOSIS — M353 Polymyalgia rheumatica: Secondary | ICD-10-CM | POA: Diagnosis not present

## 2014-12-07 DIAGNOSIS — Z6827 Body mass index (BMI) 27.0-27.9, adult: Secondary | ICD-10-CM | POA: Diagnosis not present

## 2014-12-27 DIAGNOSIS — Z6827 Body mass index (BMI) 27.0-27.9, adult: Secondary | ICD-10-CM | POA: Diagnosis not present

## 2014-12-27 DIAGNOSIS — M5430 Sciatica, unspecified side: Secondary | ICD-10-CM | POA: Diagnosis not present

## 2015-01-10 DIAGNOSIS — M5127 Other intervertebral disc displacement, lumbosacral region: Secondary | ICD-10-CM | POA: Diagnosis not present

## 2015-01-10 DIAGNOSIS — M5430 Sciatica, unspecified side: Secondary | ICD-10-CM | POA: Diagnosis not present

## 2015-01-10 DIAGNOSIS — M4316 Spondylolisthesis, lumbar region: Secondary | ICD-10-CM | POA: Diagnosis not present

## 2015-01-10 DIAGNOSIS — M5126 Other intervertebral disc displacement, lumbar region: Secondary | ICD-10-CM | POA: Diagnosis not present

## 2015-02-11 DIAGNOSIS — M5136 Other intervertebral disc degeneration, lumbar region: Secondary | ICD-10-CM | POA: Diagnosis not present

## 2015-02-11 DIAGNOSIS — I10 Essential (primary) hypertension: Secondary | ICD-10-CM | POA: Diagnosis not present

## 2015-02-11 DIAGNOSIS — Z6827 Body mass index (BMI) 27.0-27.9, adult: Secondary | ICD-10-CM | POA: Diagnosis not present

## 2015-02-11 DIAGNOSIS — E1140 Type 2 diabetes mellitus with diabetic neuropathy, unspecified: Secondary | ICD-10-CM | POA: Diagnosis not present

## 2015-02-11 DIAGNOSIS — I471 Supraventricular tachycardia: Secondary | ICD-10-CM | POA: Diagnosis not present

## 2015-02-11 DIAGNOSIS — G629 Polyneuropathy, unspecified: Secondary | ICD-10-CM | POA: Diagnosis not present

## 2015-02-11 DIAGNOSIS — E785 Hyperlipidemia, unspecified: Secondary | ICD-10-CM | POA: Diagnosis not present

## 2015-02-18 DIAGNOSIS — J069 Acute upper respiratory infection, unspecified: Secondary | ICD-10-CM | POA: Diagnosis not present

## 2015-02-18 DIAGNOSIS — J45909 Unspecified asthma, uncomplicated: Secondary | ICD-10-CM | POA: Diagnosis not present

## 2015-02-18 DIAGNOSIS — Z6828 Body mass index (BMI) 28.0-28.9, adult: Secondary | ICD-10-CM | POA: Diagnosis not present

## 2015-03-04 DIAGNOSIS — M545 Low back pain: Secondary | ICD-10-CM | POA: Diagnosis not present

## 2015-03-04 DIAGNOSIS — R2689 Other abnormalities of gait and mobility: Secondary | ICD-10-CM | POA: Diagnosis not present

## 2015-03-10 DIAGNOSIS — R2689 Other abnormalities of gait and mobility: Secondary | ICD-10-CM | POA: Diagnosis not present

## 2015-03-10 DIAGNOSIS — M545 Low back pain: Secondary | ICD-10-CM | POA: Diagnosis not present

## 2015-03-14 DIAGNOSIS — R2689 Other abnormalities of gait and mobility: Secondary | ICD-10-CM | POA: Diagnosis not present

## 2015-03-14 DIAGNOSIS — M545 Low back pain: Secondary | ICD-10-CM | POA: Diagnosis not present

## 2015-03-16 DIAGNOSIS — R2689 Other abnormalities of gait and mobility: Secondary | ICD-10-CM | POA: Diagnosis not present

## 2015-03-16 DIAGNOSIS — M545 Low back pain: Secondary | ICD-10-CM | POA: Diagnosis not present

## 2015-03-21 DIAGNOSIS — M545 Low back pain: Secondary | ICD-10-CM | POA: Diagnosis not present

## 2015-03-21 DIAGNOSIS — R2689 Other abnormalities of gait and mobility: Secondary | ICD-10-CM | POA: Diagnosis not present

## 2015-03-23 DIAGNOSIS — R2689 Other abnormalities of gait and mobility: Secondary | ICD-10-CM | POA: Diagnosis not present

## 2015-03-23 DIAGNOSIS — M545 Low back pain: Secondary | ICD-10-CM | POA: Diagnosis not present

## 2015-03-28 DIAGNOSIS — M545 Low back pain: Secondary | ICD-10-CM | POA: Diagnosis not present

## 2015-03-28 DIAGNOSIS — R2689 Other abnormalities of gait and mobility: Secondary | ICD-10-CM | POA: Diagnosis not present

## 2015-03-30 DIAGNOSIS — R2689 Other abnormalities of gait and mobility: Secondary | ICD-10-CM | POA: Diagnosis not present

## 2015-03-30 DIAGNOSIS — M545 Low back pain: Secondary | ICD-10-CM | POA: Diagnosis not present

## 2015-04-06 DIAGNOSIS — R2689 Other abnormalities of gait and mobility: Secondary | ICD-10-CM | POA: Diagnosis not present

## 2015-04-06 DIAGNOSIS — M545 Low back pain: Secondary | ICD-10-CM | POA: Diagnosis not present

## 2015-05-16 DIAGNOSIS — L821 Other seborrheic keratosis: Secondary | ICD-10-CM | POA: Diagnosis not present

## 2015-05-16 DIAGNOSIS — L570 Actinic keratosis: Secondary | ICD-10-CM | POA: Diagnosis not present

## 2015-05-16 DIAGNOSIS — L578 Other skin changes due to chronic exposure to nonionizing radiation: Secondary | ICD-10-CM | POA: Diagnosis not present

## 2015-06-13 DIAGNOSIS — M1990 Unspecified osteoarthritis, unspecified site: Secondary | ICD-10-CM | POA: Diagnosis not present

## 2015-06-13 DIAGNOSIS — I2510 Atherosclerotic heart disease of native coronary artery without angina pectoris: Secondary | ICD-10-CM | POA: Diagnosis not present

## 2015-06-13 DIAGNOSIS — E785 Hyperlipidemia, unspecified: Secondary | ICD-10-CM | POA: Diagnosis not present

## 2015-06-13 DIAGNOSIS — E079 Disorder of thyroid, unspecified: Secondary | ICD-10-CM | POA: Diagnosis not present

## 2015-06-13 DIAGNOSIS — I10 Essential (primary) hypertension: Secondary | ICD-10-CM | POA: Diagnosis not present

## 2015-06-13 DIAGNOSIS — Z6828 Body mass index (BMI) 28.0-28.9, adult: Secondary | ICD-10-CM | POA: Diagnosis not present

## 2015-06-13 DIAGNOSIS — E1122 Type 2 diabetes mellitus with diabetic chronic kidney disease: Secondary | ICD-10-CM | POA: Diagnosis not present

## 2015-06-23 DIAGNOSIS — E119 Type 2 diabetes mellitus without complications: Secondary | ICD-10-CM | POA: Diagnosis not present

## 2015-06-23 DIAGNOSIS — Z0100 Encounter for examination of eyes and vision without abnormal findings: Secondary | ICD-10-CM | POA: Diagnosis not present

## 2015-07-07 DIAGNOSIS — I471 Supraventricular tachycardia: Secondary | ICD-10-CM | POA: Diagnosis not present

## 2015-07-07 DIAGNOSIS — E1169 Type 2 diabetes mellitus with other specified complication: Secondary | ICD-10-CM | POA: Diagnosis not present

## 2015-07-07 DIAGNOSIS — I2510 Atherosclerotic heart disease of native coronary artery without angina pectoris: Secondary | ICD-10-CM | POA: Diagnosis not present

## 2015-07-07 DIAGNOSIS — I10 Essential (primary) hypertension: Secondary | ICD-10-CM | POA: Diagnosis not present

## 2015-07-07 DIAGNOSIS — E785 Hyperlipidemia, unspecified: Secondary | ICD-10-CM | POA: Diagnosis not present

## 2015-07-25 DIAGNOSIS — M1990 Unspecified osteoarthritis, unspecified site: Secondary | ICD-10-CM | POA: Diagnosis not present

## 2015-07-25 DIAGNOSIS — Z6828 Body mass index (BMI) 28.0-28.9, adult: Secondary | ICD-10-CM | POA: Diagnosis not present

## 2015-07-25 DIAGNOSIS — I10 Essential (primary) hypertension: Secondary | ICD-10-CM | POA: Diagnosis not present

## 2015-08-04 DIAGNOSIS — M9901 Segmental and somatic dysfunction of cervical region: Secondary | ICD-10-CM | POA: Diagnosis not present

## 2015-08-04 DIAGNOSIS — M542 Cervicalgia: Secondary | ICD-10-CM | POA: Diagnosis not present

## 2015-10-27 DIAGNOSIS — G629 Polyneuropathy, unspecified: Secondary | ICD-10-CM | POA: Diagnosis not present

## 2015-10-27 DIAGNOSIS — E785 Hyperlipidemia, unspecified: Secondary | ICD-10-CM | POA: Diagnosis not present

## 2015-10-27 DIAGNOSIS — M1990 Unspecified osteoarthritis, unspecified site: Secondary | ICD-10-CM | POA: Diagnosis not present

## 2015-10-27 DIAGNOSIS — Z9181 History of falling: Secondary | ICD-10-CM | POA: Diagnosis not present

## 2015-10-27 DIAGNOSIS — Z1389 Encounter for screening for other disorder: Secondary | ICD-10-CM | POA: Diagnosis not present

## 2015-10-27 DIAGNOSIS — J4520 Mild intermittent asthma, uncomplicated: Secondary | ICD-10-CM | POA: Diagnosis not present

## 2015-10-27 DIAGNOSIS — E1122 Type 2 diabetes mellitus with diabetic chronic kidney disease: Secondary | ICD-10-CM | POA: Diagnosis not present

## 2015-10-27 DIAGNOSIS — M353 Polymyalgia rheumatica: Secondary | ICD-10-CM | POA: Diagnosis not present

## 2015-10-27 DIAGNOSIS — K219 Gastro-esophageal reflux disease without esophagitis: Secondary | ICD-10-CM | POA: Diagnosis not present

## 2015-10-27 DIAGNOSIS — I10 Essential (primary) hypertension: Secondary | ICD-10-CM | POA: Diagnosis not present

## 2015-10-27 DIAGNOSIS — E559 Vitamin D deficiency, unspecified: Secondary | ICD-10-CM | POA: Diagnosis not present

## 2016-02-06 DIAGNOSIS — M8589 Other specified disorders of bone density and structure, multiple sites: Secondary | ICD-10-CM | POA: Diagnosis not present

## 2016-02-06 DIAGNOSIS — D489 Neoplasm of uncertain behavior, unspecified: Secondary | ICD-10-CM | POA: Diagnosis not present

## 2016-02-06 DIAGNOSIS — Z1231 Encounter for screening mammogram for malignant neoplasm of breast: Secondary | ICD-10-CM | POA: Diagnosis not present

## 2016-02-06 DIAGNOSIS — M1990 Unspecified osteoarthritis, unspecified site: Secondary | ICD-10-CM | POA: Diagnosis not present

## 2016-02-28 DIAGNOSIS — M62838 Other muscle spasm: Secondary | ICD-10-CM | POA: Diagnosis not present

## 2016-02-28 DIAGNOSIS — M1990 Unspecified osteoarthritis, unspecified site: Secondary | ICD-10-CM | POA: Diagnosis not present

## 2016-02-28 DIAGNOSIS — E1122 Type 2 diabetes mellitus with diabetic chronic kidney disease: Secondary | ICD-10-CM | POA: Diagnosis not present

## 2016-02-28 DIAGNOSIS — E079 Disorder of thyroid, unspecified: Secondary | ICD-10-CM | POA: Diagnosis not present

## 2016-02-28 DIAGNOSIS — I10 Essential (primary) hypertension: Secondary | ICD-10-CM | POA: Diagnosis not present

## 2016-02-28 DIAGNOSIS — Z1231 Encounter for screening mammogram for malignant neoplasm of breast: Secondary | ICD-10-CM | POA: Diagnosis not present

## 2016-02-28 DIAGNOSIS — J4520 Mild intermittent asthma, uncomplicated: Secondary | ICD-10-CM | POA: Diagnosis not present

## 2016-02-28 DIAGNOSIS — K219 Gastro-esophageal reflux disease without esophagitis: Secondary | ICD-10-CM | POA: Diagnosis not present

## 2016-02-28 DIAGNOSIS — E785 Hyperlipidemia, unspecified: Secondary | ICD-10-CM | POA: Diagnosis not present

## 2016-02-28 DIAGNOSIS — M8589 Other specified disorders of bone density and structure, multiple sites: Secondary | ICD-10-CM | POA: Diagnosis not present

## 2016-03-01 DIAGNOSIS — L299 Pruritus, unspecified: Secondary | ICD-10-CM | POA: Diagnosis not present

## 2016-03-01 DIAGNOSIS — H61002 Unspecified perichondritis of left external ear: Secondary | ICD-10-CM | POA: Diagnosis not present

## 2016-03-02 DIAGNOSIS — I471 Supraventricular tachycardia: Secondary | ICD-10-CM | POA: Diagnosis not present

## 2016-03-02 DIAGNOSIS — E785 Hyperlipidemia, unspecified: Secondary | ICD-10-CM | POA: Diagnosis not present

## 2016-03-02 DIAGNOSIS — I2510 Atherosclerotic heart disease of native coronary artery without angina pectoris: Secondary | ICD-10-CM | POA: Diagnosis not present

## 2016-03-02 DIAGNOSIS — I10 Essential (primary) hypertension: Secondary | ICD-10-CM | POA: Diagnosis not present

## 2016-03-02 DIAGNOSIS — E1169 Type 2 diabetes mellitus with other specified complication: Secondary | ICD-10-CM | POA: Diagnosis not present

## 2016-04-03 DIAGNOSIS — C44229 Squamous cell carcinoma of skin of left ear and external auricular canal: Secondary | ICD-10-CM | POA: Diagnosis not present

## 2016-04-27 DIAGNOSIS — H61002 Unspecified perichondritis of left external ear: Secondary | ICD-10-CM | POA: Diagnosis not present

## 2016-04-27 DIAGNOSIS — L570 Actinic keratosis: Secondary | ICD-10-CM | POA: Diagnosis not present

## 2016-05-02 DIAGNOSIS — Z13820 Encounter for screening for osteoporosis: Secondary | ICD-10-CM | POA: Diagnosis not present

## 2016-05-02 DIAGNOSIS — M8589 Other specified disorders of bone density and structure, multiple sites: Secondary | ICD-10-CM | POA: Diagnosis not present

## 2016-05-02 DIAGNOSIS — Z1231 Encounter for screening mammogram for malignant neoplasm of breast: Secondary | ICD-10-CM | POA: Diagnosis not present

## 2016-06-05 DIAGNOSIS — Z136 Encounter for screening for cardiovascular disorders: Secondary | ICD-10-CM | POA: Diagnosis not present

## 2016-06-05 DIAGNOSIS — Z0000 Encounter for general adult medical examination without abnormal findings: Secondary | ICD-10-CM | POA: Diagnosis not present

## 2016-06-05 DIAGNOSIS — Z23 Encounter for immunization: Secondary | ICD-10-CM | POA: Diagnosis not present

## 2016-06-05 DIAGNOSIS — E785 Hyperlipidemia, unspecified: Secondary | ICD-10-CM | POA: Diagnosis not present

## 2016-06-05 DIAGNOSIS — Z1389 Encounter for screening for other disorder: Secondary | ICD-10-CM | POA: Diagnosis not present

## 2016-06-05 DIAGNOSIS — Z9181 History of falling: Secondary | ICD-10-CM | POA: Diagnosis not present

## 2016-06-28 DIAGNOSIS — I10 Essential (primary) hypertension: Secondary | ICD-10-CM | POA: Diagnosis not present

## 2016-06-28 DIAGNOSIS — Z139 Encounter for screening, unspecified: Secondary | ICD-10-CM | POA: Diagnosis not present

## 2016-06-28 DIAGNOSIS — R251 Tremor, unspecified: Secondary | ICD-10-CM | POA: Diagnosis not present

## 2016-06-28 DIAGNOSIS — M1990 Unspecified osteoarthritis, unspecified site: Secondary | ICD-10-CM | POA: Diagnosis not present

## 2016-06-28 DIAGNOSIS — I471 Supraventricular tachycardia: Secondary | ICD-10-CM | POA: Diagnosis not present

## 2016-06-28 DIAGNOSIS — J4520 Mild intermittent asthma, uncomplicated: Secondary | ICD-10-CM | POA: Diagnosis not present

## 2016-06-28 DIAGNOSIS — E1122 Type 2 diabetes mellitus with diabetic chronic kidney disease: Secondary | ICD-10-CM | POA: Diagnosis not present

## 2016-06-28 DIAGNOSIS — Z6828 Body mass index (BMI) 28.0-28.9, adult: Secondary | ICD-10-CM | POA: Diagnosis not present

## 2016-06-28 DIAGNOSIS — E663 Overweight: Secondary | ICD-10-CM | POA: Diagnosis not present

## 2016-07-17 DIAGNOSIS — E785 Hyperlipidemia, unspecified: Secondary | ICD-10-CM | POA: Diagnosis not present

## 2016-07-17 DIAGNOSIS — I10 Essential (primary) hypertension: Secondary | ICD-10-CM | POA: Diagnosis not present

## 2016-07-17 DIAGNOSIS — E1169 Type 2 diabetes mellitus with other specified complication: Secondary | ICD-10-CM | POA: Diagnosis not present

## 2016-07-17 DIAGNOSIS — I2510 Atherosclerotic heart disease of native coronary artery without angina pectoris: Secondary | ICD-10-CM | POA: Diagnosis not present

## 2016-07-17 DIAGNOSIS — I471 Supraventricular tachycardia: Secondary | ICD-10-CM | POA: Diagnosis not present

## 2016-08-06 DIAGNOSIS — M1990 Unspecified osteoarthritis, unspecified site: Secondary | ICD-10-CM | POA: Diagnosis not present

## 2016-08-06 DIAGNOSIS — I10 Essential (primary) hypertension: Secondary | ICD-10-CM | POA: Diagnosis not present

## 2016-09-19 DIAGNOSIS — I471 Supraventricular tachycardia: Secondary | ICD-10-CM | POA: Diagnosis not present

## 2016-09-19 DIAGNOSIS — N761 Subacute and chronic vaginitis: Secondary | ICD-10-CM | POA: Diagnosis not present

## 2016-09-19 DIAGNOSIS — I10 Essential (primary) hypertension: Secondary | ICD-10-CM | POA: Diagnosis not present

## 2016-09-20 DIAGNOSIS — H61002 Unspecified perichondritis of left external ear: Secondary | ICD-10-CM | POA: Diagnosis not present

## 2016-10-10 DIAGNOSIS — M1990 Unspecified osteoarthritis, unspecified site: Secondary | ICD-10-CM | POA: Diagnosis not present

## 2016-10-10 DIAGNOSIS — M542 Cervicalgia: Secondary | ICD-10-CM | POA: Diagnosis not present

## 2016-10-10 DIAGNOSIS — M353 Polymyalgia rheumatica: Secondary | ICD-10-CM | POA: Diagnosis not present

## 2016-10-18 DIAGNOSIS — H04123 Dry eye syndrome of bilateral lacrimal glands: Secondary | ICD-10-CM | POA: Diagnosis not present

## 2016-10-18 DIAGNOSIS — E119 Type 2 diabetes mellitus without complications: Secondary | ICD-10-CM | POA: Diagnosis not present

## 2016-10-18 DIAGNOSIS — Z961 Presence of intraocular lens: Secondary | ICD-10-CM | POA: Diagnosis not present

## 2016-10-30 DIAGNOSIS — Z139 Encounter for screening, unspecified: Secondary | ICD-10-CM | POA: Diagnosis not present

## 2016-10-30 DIAGNOSIS — E1122 Type 2 diabetes mellitus with diabetic chronic kidney disease: Secondary | ICD-10-CM | POA: Diagnosis not present

## 2016-10-30 DIAGNOSIS — Z9181 History of falling: Secondary | ICD-10-CM | POA: Diagnosis not present

## 2016-10-30 DIAGNOSIS — E785 Hyperlipidemia, unspecified: Secondary | ICD-10-CM | POA: Diagnosis not present

## 2016-10-30 DIAGNOSIS — I10 Essential (primary) hypertension: Secondary | ICD-10-CM | POA: Diagnosis not present

## 2016-10-30 DIAGNOSIS — J4520 Mild intermittent asthma, uncomplicated: Secondary | ICD-10-CM | POA: Diagnosis not present

## 2016-10-30 DIAGNOSIS — N289 Disorder of kidney and ureter, unspecified: Secondary | ICD-10-CM | POA: Diagnosis not present

## 2016-11-14 DIAGNOSIS — L570 Actinic keratosis: Secondary | ICD-10-CM | POA: Diagnosis not present

## 2016-11-14 DIAGNOSIS — H61002 Unspecified perichondritis of left external ear: Secondary | ICD-10-CM | POA: Diagnosis not present

## 2016-11-19 DIAGNOSIS — Z23 Encounter for immunization: Secondary | ICD-10-CM | POA: Diagnosis not present

## 2016-11-19 DIAGNOSIS — M545 Low back pain: Secondary | ICD-10-CM | POA: Diagnosis not present

## 2016-11-19 DIAGNOSIS — M5136 Other intervertebral disc degeneration, lumbar region: Secondary | ICD-10-CM | POA: Diagnosis not present

## 2016-12-11 DIAGNOSIS — M25562 Pain in left knee: Secondary | ICD-10-CM | POA: Diagnosis not present

## 2016-12-11 DIAGNOSIS — M1712 Unilateral primary osteoarthritis, left knee: Secondary | ICD-10-CM | POA: Diagnosis not present

## 2017-01-16 DIAGNOSIS — H61002 Unspecified perichondritis of left external ear: Secondary | ICD-10-CM | POA: Diagnosis not present

## 2017-01-16 DIAGNOSIS — L570 Actinic keratosis: Secondary | ICD-10-CM | POA: Diagnosis not present

## 2017-03-04 DIAGNOSIS — E1140 Type 2 diabetes mellitus with diabetic neuropathy, unspecified: Secondary | ICD-10-CM | POA: Diagnosis not present

## 2017-03-04 DIAGNOSIS — N289 Disorder of kidney and ureter, unspecified: Secondary | ICD-10-CM | POA: Diagnosis not present

## 2017-03-04 DIAGNOSIS — E663 Overweight: Secondary | ICD-10-CM | POA: Diagnosis not present

## 2017-03-04 DIAGNOSIS — I471 Supraventricular tachycardia: Secondary | ICD-10-CM | POA: Diagnosis not present

## 2017-03-04 DIAGNOSIS — I10 Essential (primary) hypertension: Secondary | ICD-10-CM | POA: Diagnosis not present

## 2017-03-04 DIAGNOSIS — E785 Hyperlipidemia, unspecified: Secondary | ICD-10-CM | POA: Diagnosis not present

## 2017-03-04 DIAGNOSIS — E1122 Type 2 diabetes mellitus with diabetic chronic kidney disease: Secondary | ICD-10-CM | POA: Diagnosis not present

## 2017-03-04 DIAGNOSIS — Z1331 Encounter for screening for depression: Secondary | ICD-10-CM | POA: Diagnosis not present

## 2017-03-04 DIAGNOSIS — Z1389 Encounter for screening for other disorder: Secondary | ICD-10-CM | POA: Diagnosis not present

## 2017-03-04 DIAGNOSIS — Z6828 Body mass index (BMI) 28.0-28.9, adult: Secondary | ICD-10-CM | POA: Diagnosis not present

## 2017-03-19 DIAGNOSIS — R1032 Left lower quadrant pain: Secondary | ICD-10-CM | POA: Diagnosis not present

## 2017-03-29 DIAGNOSIS — R1032 Left lower quadrant pain: Secondary | ICD-10-CM | POA: Diagnosis not present

## 2017-04-18 DIAGNOSIS — H6123 Impacted cerumen, bilateral: Secondary | ICD-10-CM | POA: Diagnosis not present

## 2017-04-18 DIAGNOSIS — H938X3 Other specified disorders of ear, bilateral: Secondary | ICD-10-CM | POA: Diagnosis not present

## 2017-04-23 DIAGNOSIS — I10 Essential (primary) hypertension: Secondary | ICD-10-CM | POA: Diagnosis not present

## 2017-04-23 DIAGNOSIS — M545 Low back pain: Secondary | ICD-10-CM | POA: Diagnosis not present

## 2017-06-12 DIAGNOSIS — Z0000 Encounter for general adult medical examination without abnormal findings: Secondary | ICD-10-CM | POA: Diagnosis not present

## 2017-06-12 DIAGNOSIS — Z1331 Encounter for screening for depression: Secondary | ICD-10-CM | POA: Diagnosis not present

## 2017-06-12 DIAGNOSIS — Z136 Encounter for screening for cardiovascular disorders: Secondary | ICD-10-CM | POA: Diagnosis not present

## 2017-06-12 DIAGNOSIS — E785 Hyperlipidemia, unspecified: Secondary | ICD-10-CM | POA: Diagnosis not present

## 2017-06-12 DIAGNOSIS — Z1231 Encounter for screening mammogram for malignant neoplasm of breast: Secondary | ICD-10-CM | POA: Diagnosis not present

## 2017-06-12 DIAGNOSIS — Z9181 History of falling: Secondary | ICD-10-CM | POA: Diagnosis not present

## 2017-07-02 DIAGNOSIS — I10 Essential (primary) hypertension: Secondary | ICD-10-CM | POA: Diagnosis not present

## 2017-07-02 DIAGNOSIS — I471 Supraventricular tachycardia: Secondary | ICD-10-CM | POA: Diagnosis not present

## 2017-07-02 DIAGNOSIS — E079 Disorder of thyroid, unspecified: Secondary | ICD-10-CM | POA: Diagnosis not present

## 2017-07-02 DIAGNOSIS — N289 Disorder of kidney and ureter, unspecified: Secondary | ICD-10-CM | POA: Diagnosis not present

## 2017-07-02 DIAGNOSIS — I679 Cerebrovascular disease, unspecified: Secondary | ICD-10-CM | POA: Diagnosis not present

## 2017-07-02 DIAGNOSIS — E785 Hyperlipidemia, unspecified: Secondary | ICD-10-CM | POA: Diagnosis not present

## 2017-07-02 DIAGNOSIS — E1122 Type 2 diabetes mellitus with diabetic chronic kidney disease: Secondary | ICD-10-CM | POA: Diagnosis not present

## 2017-07-08 DIAGNOSIS — R252 Cramp and spasm: Secondary | ICD-10-CM | POA: Diagnosis not present

## 2017-07-08 DIAGNOSIS — R6883 Chills (without fever): Secondary | ICD-10-CM | POA: Diagnosis not present

## 2017-07-08 DIAGNOSIS — N289 Disorder of kidney and ureter, unspecified: Secondary | ICD-10-CM | POA: Diagnosis not present

## 2017-07-09 DIAGNOSIS — Z8679 Personal history of other diseases of the circulatory system: Secondary | ICD-10-CM | POA: Diagnosis not present

## 2017-07-09 DIAGNOSIS — R9431 Abnormal electrocardiogram [ECG] [EKG]: Secondary | ICD-10-CM | POA: Diagnosis not present

## 2017-07-09 DIAGNOSIS — I2510 Atherosclerotic heart disease of native coronary artery without angina pectoris: Secondary | ICD-10-CM | POA: Insufficient documentation

## 2017-07-09 DIAGNOSIS — I25812 Atherosclerosis of bypass graft of coronary artery of transplanted heart without angina pectoris: Secondary | ICD-10-CM | POA: Diagnosis not present

## 2017-07-09 DIAGNOSIS — I471 Supraventricular tachycardia: Secondary | ICD-10-CM | POA: Diagnosis not present

## 2017-08-08 DIAGNOSIS — R928 Other abnormal and inconclusive findings on diagnostic imaging of breast: Secondary | ICD-10-CM | POA: Diagnosis not present

## 2017-08-08 DIAGNOSIS — Z1231 Encounter for screening mammogram for malignant neoplasm of breast: Secondary | ICD-10-CM | POA: Diagnosis not present

## 2017-08-26 DIAGNOSIS — M1712 Unilateral primary osteoarthritis, left knee: Secondary | ICD-10-CM | POA: Diagnosis not present

## 2017-08-28 DIAGNOSIS — R928 Other abnormal and inconclusive findings on diagnostic imaging of breast: Secondary | ICD-10-CM | POA: Diagnosis not present

## 2017-10-11 DIAGNOSIS — N761 Subacute and chronic vaginitis: Secondary | ICD-10-CM | POA: Diagnosis not present

## 2017-10-11 DIAGNOSIS — N3941 Urge incontinence: Secondary | ICD-10-CM | POA: Diagnosis not present

## 2017-10-11 DIAGNOSIS — R600 Localized edema: Secondary | ICD-10-CM | POA: Diagnosis not present

## 2017-10-11 DIAGNOSIS — E1140 Type 2 diabetes mellitus with diabetic neuropathy, unspecified: Secondary | ICD-10-CM | POA: Diagnosis not present

## 2017-10-11 DIAGNOSIS — K219 Gastro-esophageal reflux disease without esophagitis: Secondary | ICD-10-CM | POA: Diagnosis not present

## 2017-11-04 DIAGNOSIS — N289 Disorder of kidney and ureter, unspecified: Secondary | ICD-10-CM | POA: Diagnosis not present

## 2017-11-04 DIAGNOSIS — I471 Supraventricular tachycardia: Secondary | ICD-10-CM | POA: Diagnosis not present

## 2017-11-04 DIAGNOSIS — Z139 Encounter for screening, unspecified: Secondary | ICD-10-CM | POA: Diagnosis not present

## 2017-11-04 DIAGNOSIS — E1122 Type 2 diabetes mellitus with diabetic chronic kidney disease: Secondary | ICD-10-CM | POA: Diagnosis not present

## 2017-11-04 DIAGNOSIS — Z1339 Encounter for screening examination for other mental health and behavioral disorders: Secondary | ICD-10-CM | POA: Diagnosis not present

## 2017-11-04 DIAGNOSIS — Z0100 Encounter for examination of eyes and vision without abnormal findings: Secondary | ICD-10-CM | POA: Diagnosis not present

## 2017-11-04 DIAGNOSIS — E113299 Type 2 diabetes mellitus with mild nonproliferative diabetic retinopathy without macular edema, unspecified eye: Secondary | ICD-10-CM | POA: Diagnosis not present

## 2017-11-04 DIAGNOSIS — E1140 Type 2 diabetes mellitus with diabetic neuropathy, unspecified: Secondary | ICD-10-CM | POA: Diagnosis not present

## 2017-11-04 DIAGNOSIS — H04123 Dry eye syndrome of bilateral lacrimal glands: Secondary | ICD-10-CM | POA: Diagnosis not present

## 2017-11-04 DIAGNOSIS — Z961 Presence of intraocular lens: Secondary | ICD-10-CM | POA: Diagnosis not present

## 2017-11-04 DIAGNOSIS — Z23 Encounter for immunization: Secondary | ICD-10-CM | POA: Diagnosis not present

## 2017-11-04 DIAGNOSIS — E785 Hyperlipidemia, unspecified: Secondary | ICD-10-CM | POA: Diagnosis not present

## 2017-11-04 DIAGNOSIS — I10 Essential (primary) hypertension: Secondary | ICD-10-CM | POA: Diagnosis not present

## 2017-11-22 DIAGNOSIS — M1712 Unilateral primary osteoarthritis, left knee: Secondary | ICD-10-CM | POA: Diagnosis not present

## 2017-11-25 DIAGNOSIS — R2689 Other abnormalities of gait and mobility: Secondary | ICD-10-CM | POA: Diagnosis not present

## 2017-11-25 DIAGNOSIS — M25562 Pain in left knee: Secondary | ICD-10-CM | POA: Diagnosis not present

## 2017-11-29 DIAGNOSIS — M25562 Pain in left knee: Secondary | ICD-10-CM | POA: Diagnosis not present

## 2017-11-29 DIAGNOSIS — R2689 Other abnormalities of gait and mobility: Secondary | ICD-10-CM | POA: Diagnosis not present

## 2017-12-01 DIAGNOSIS — S233XXA Sprain of ligaments of thoracic spine, initial encounter: Secondary | ICD-10-CM | POA: Diagnosis not present

## 2017-12-09 DIAGNOSIS — M5136 Other intervertebral disc degeneration, lumbar region: Secondary | ICD-10-CM | POA: Diagnosis not present

## 2017-12-09 DIAGNOSIS — M5430 Sciatica, unspecified side: Secondary | ICD-10-CM | POA: Diagnosis not present

## 2017-12-09 DIAGNOSIS — Z6829 Body mass index (BMI) 29.0-29.9, adult: Secondary | ICD-10-CM | POA: Diagnosis not present

## 2017-12-21 DIAGNOSIS — M5187 Other intervertebral disc disorders, lumbosacral region: Secondary | ICD-10-CM | POA: Diagnosis not present

## 2017-12-21 DIAGNOSIS — M5430 Sciatica, unspecified side: Secondary | ICD-10-CM | POA: Diagnosis not present

## 2017-12-21 DIAGNOSIS — W19XXXA Unspecified fall, initial encounter: Secondary | ICD-10-CM | POA: Diagnosis not present

## 2017-12-21 DIAGNOSIS — S32020A Wedge compression fracture of second lumbar vertebra, initial encounter for closed fracture: Secondary | ICD-10-CM | POA: Diagnosis not present

## 2017-12-21 DIAGNOSIS — S32030A Wedge compression fracture of third lumbar vertebra, initial encounter for closed fracture: Secondary | ICD-10-CM | POA: Diagnosis not present

## 2018-01-08 DIAGNOSIS — Z6827 Body mass index (BMI) 27.0-27.9, adult: Secondary | ICD-10-CM | POA: Diagnosis not present

## 2018-01-08 DIAGNOSIS — I10 Essential (primary) hypertension: Secondary | ICD-10-CM | POA: Diagnosis not present

## 2018-01-08 DIAGNOSIS — S32009A Unspecified fracture of unspecified lumbar vertebra, initial encounter for closed fracture: Secondary | ICD-10-CM | POA: Diagnosis not present

## 2018-01-08 DIAGNOSIS — M48061 Spinal stenosis, lumbar region without neurogenic claudication: Secondary | ICD-10-CM | POA: Diagnosis not present

## 2018-01-24 DIAGNOSIS — Z6829 Body mass index (BMI) 29.0-29.9, adult: Secondary | ICD-10-CM | POA: Diagnosis not present

## 2018-01-24 DIAGNOSIS — S32000S Wedge compression fracture of unspecified lumbar vertebra, sequela: Secondary | ICD-10-CM | POA: Diagnosis not present

## 2018-01-24 DIAGNOSIS — R51 Headache: Secondary | ICD-10-CM | POA: Diagnosis not present

## 2018-01-24 DIAGNOSIS — M5137 Other intervertebral disc degeneration, lumbosacral region: Secondary | ICD-10-CM | POA: Diagnosis not present

## 2018-01-24 DIAGNOSIS — R600 Localized edema: Secondary | ICD-10-CM | POA: Diagnosis not present

## 2018-01-29 DIAGNOSIS — L570 Actinic keratosis: Secondary | ICD-10-CM | POA: Diagnosis not present

## 2018-01-29 DIAGNOSIS — L728 Other follicular cysts of the skin and subcutaneous tissue: Secondary | ICD-10-CM | POA: Diagnosis not present

## 2018-01-29 DIAGNOSIS — L700 Acne vulgaris: Secondary | ICD-10-CM | POA: Diagnosis not present

## 2018-01-29 DIAGNOSIS — H61002 Unspecified perichondritis of left external ear: Secondary | ICD-10-CM | POA: Diagnosis not present

## 2018-02-07 DIAGNOSIS — H1132 Conjunctival hemorrhage, left eye: Secondary | ICD-10-CM | POA: Diagnosis not present

## 2018-02-20 ENCOUNTER — Other Ambulatory Visit: Payer: Self-pay

## 2018-02-20 ENCOUNTER — Emergency Department (HOSPITAL_COMMUNITY): Payer: Medicare HMO

## 2018-02-20 ENCOUNTER — Encounter (HOSPITAL_COMMUNITY): Payer: Self-pay | Admitting: Emergency Medicine

## 2018-02-20 ENCOUNTER — Emergency Department (HOSPITAL_COMMUNITY)
Admission: EM | Admit: 2018-02-20 | Discharge: 2018-02-21 | Disposition: A | Discharge status: Home / Self Care | Payer: Medicare HMO | Attending: Emergency Medicine | Admitting: Emergency Medicine

## 2018-02-20 DIAGNOSIS — Z7984 Long term (current) use of oral hypoglycemic drugs: Secondary | ICD-10-CM | POA: Insufficient documentation

## 2018-02-20 DIAGNOSIS — Y929 Unspecified place or not applicable: Secondary | ICD-10-CM | POA: Diagnosis not present

## 2018-02-20 DIAGNOSIS — E876 Hypokalemia: Secondary | ICD-10-CM

## 2018-02-20 DIAGNOSIS — T502X5A Adverse effect of carbonic-anhydrase inhibitors, benzothiadiazides and other diuretics, initial encounter: Secondary | ICD-10-CM | POA: Diagnosis not present

## 2018-02-20 DIAGNOSIS — E119 Type 2 diabetes mellitus without complications: Secondary | ICD-10-CM | POA: Diagnosis not present

## 2018-02-20 DIAGNOSIS — Z7901 Long term (current) use of anticoagulants: Secondary | ICD-10-CM | POA: Diagnosis not present

## 2018-02-20 DIAGNOSIS — I110 Hypertensive heart disease with heart failure: Secondary | ICD-10-CM | POA: Insufficient documentation

## 2018-02-20 DIAGNOSIS — T887XXA Unspecified adverse effect of drug or medicament, initial encounter: Secondary | ICD-10-CM | POA: Insufficient documentation

## 2018-02-20 DIAGNOSIS — Y939 Activity, unspecified: Secondary | ICD-10-CM | POA: Diagnosis not present

## 2018-02-20 DIAGNOSIS — S0990XA Unspecified injury of head, initial encounter: Secondary | ICD-10-CM | POA: Diagnosis not present

## 2018-02-20 DIAGNOSIS — Y658 Other specified misadventures during surgical and medical care: Secondary | ICD-10-CM | POA: Diagnosis not present

## 2018-02-20 DIAGNOSIS — D649 Anemia, unspecified: Secondary | ICD-10-CM

## 2018-02-20 DIAGNOSIS — M1712 Unilateral primary osteoarthritis, left knee: Secondary | ICD-10-CM | POA: Diagnosis not present

## 2018-02-20 DIAGNOSIS — Y92009 Unspecified place in unspecified non-institutional (private) residence as the place of occurrence of the external cause: Secondary | ICD-10-CM

## 2018-02-20 DIAGNOSIS — S0003XA Contusion of scalp, initial encounter: Secondary | ICD-10-CM | POA: Diagnosis not present

## 2018-02-20 DIAGNOSIS — I509 Heart failure, unspecified: Secondary | ICD-10-CM | POA: Insufficient documentation

## 2018-02-20 DIAGNOSIS — Y999 Unspecified external cause status: Secondary | ICD-10-CM | POA: Diagnosis not present

## 2018-02-20 DIAGNOSIS — W07XXXA Fall from chair, initial encounter: Secondary | ICD-10-CM | POA: Diagnosis not present

## 2018-02-20 DIAGNOSIS — W19XXXA Unspecified fall, initial encounter: Secondary | ICD-10-CM

## 2018-02-20 DIAGNOSIS — R5383 Other fatigue: Secondary | ICD-10-CM | POA: Diagnosis not present

## 2018-02-20 LAB — BASIC METABOLIC PANEL
Anion gap: 10 (ref 5–15)
BUN: 21 mg/dL (ref 8–23)
CO2: 26 mmol/L (ref 22–32)
Calcium: 9.1 mg/dL (ref 8.9–10.3)
Chloride: 97 mmol/L — ABNORMAL LOW (ref 98–111)
Creatinine, Ser: 1.13 mg/dL — ABNORMAL HIGH (ref 0.44–1.00)
GFR calc Af Amer: 51 mL/min — ABNORMAL LOW (ref >60)
GFR calc non Af Amer: 44 mL/min — ABNORMAL LOW (ref >60)
Glucose, Bld: 235 mg/dL — ABNORMAL HIGH (ref 70–99)
POTASSIUM: 3.4 mmol/L — AB (ref 3.5–5.1)
Sodium: 133 mmol/L — ABNORMAL LOW (ref 135–145)

## 2018-02-20 LAB — CBC
HEMATOCRIT: 35.1 % — AB (ref 36.0–46.0)
HEMOGLOBIN: 11 g/dL — AB (ref 12.0–15.0)
MCH: 27.2 pg (ref 26.0–34.0)
MCHC: 31.3 g/dL (ref 30.0–36.0)
MCV: 86.9 fL (ref 80.0–100.0)
Platelets: 361 10*3/uL (ref 150–400)
RBC: 4.04 MIL/uL (ref 3.87–5.11)
RDW: 13.2 % (ref 11.5–15.5)
WBC: 11.7 10*3/uL — ABNORMAL HIGH (ref 4.0–10.5)
nRBC: 0 % (ref 0.0–0.2)

## 2018-02-20 NOTE — ED Triage Notes (Addendum)
Onset today patient sitting on a chair reached for walker has general weakness and fell out of the chair hitting the back of her head. Denies LOC Alert answering and following commands appropriate. Patient and family members state have been falling more frequently recently. Patient is on Plavix.

## 2018-02-21 LAB — URINALYSIS, ROUTINE W REFLEX MICROSCOPIC
Bacteria, UA: NONE SEEN
Bilirubin Urine: NEGATIVE
Glucose, UA: NEGATIVE mg/dL
HGB URINE DIPSTICK: NEGATIVE
KETONES UR: NEGATIVE mg/dL
Nitrite: NEGATIVE
Protein, ur: NEGATIVE mg/dL
Specific Gravity, Urine: 1.013 (ref 1.005–1.030)
pH: 5 (ref 5.0–8.0)

## 2018-02-21 MED ORDER — POTASSIUM CHLORIDE CRYS ER 20 MEQ PO TBCR
40.0000 meq | EXTENDED_RELEASE_TABLET | Freq: Once | ORAL | Status: AC
  Administered 2018-02-21: 40 meq via ORAL
  Filled 2018-02-21: qty 2

## 2018-02-21 MED ORDER — POTASSIUM CHLORIDE ER 10 MEQ PO TBCR: 10.0000 meq | EXTENDED_RELEASE_TABLET | Freq: Two times a day (BID) | ORAL | 0 refills | Status: AC

## 2018-02-21 NOTE — ED Notes (Signed)
Patient verbalizes understanding of medications and discharge instructions. No further questions at this time. VSS and patient ambulatory at discharge.   

## 2018-02-21 NOTE — ED Notes (Signed)
MD aware of BP

## 2018-02-21 NOTE — ED Provider Notes (Signed)
[facility] EMERGENCY DEPARTMENT Provider Note   CSN: [id number] Arrival date & time: 02/20/18  1633     History   Chief Complaint Chief Complaint  Patient presents with  . Fatigue  . Fall  . Head Injury    HPI Sherri Long is a 85 y.o. female.  The history is provided by the patient and a relative.  She has history of hypertension, diabetes, hyperlipidemia, heart failure, neuropathy and comes in following a fall at home.  She was trying to get out of a chair and she states that her left knee gave out on her.  She has been having problems with her left knee and received a steroid injection and earlier in the day.  She did hit her head when she fell, but denies loss of consciousness and denies other injury.  She has fallen several times in the last several days and she relates all of them to pain in her left knee.  She has also noticed that she is generally weak.  Family has noted confusion.  There have been no new medications.  She does take tramadol for pain and gabapentin for neuropathy.  Past Medical History:  Diagnosis Date  . Arthritis   . CHF (congestive heart failure) ([place])   . Diabetes mellitus without complication ([place])   . Hypertension   . Neuropathy     Patient Active Problem List   Diagnosis Date Noted  . TIA (transient ischemic attack) 10/09/2013  . Weakness 10/08/2013  . Diabetes mellitus type 2, controlled ([place]) 10/08/2013  . HTN (hypertension) 10/08/2013  . HLD (hyperlipidemia) 10/08/2013  . Gout 10/08/2013    Past Surgical History:  Procedure Laterality Date  . RIGHT SHOULDER        OB History   No obstetric history on file.      Home Medications    Prior to Admission medications   Medication Sig Start Date End Date Taking? Authorizing Provider  allopurinol (ZYLOPRIM) 300 MG tablet Take 300 mg by mouth 4 (four) times a week.     [provider]  beta carotene w/minerals (OCUVITE) tablet Take 1 tablet by mouth  daily.    [provider]  Biotin 5 MG TABS Take 5 mg by mouth every morning.     [provider]  cholecalciferol (VITAMIN D) 1000 UNITS tablet Take 1,000 Units by mouth at bedtime.     [provider]  Chromium 200 MCG TABS Take 200 mcg by mouth daily.    [provider]  clopidogrel (PLAVIX) 75 MG tablet Take 1 tablet (75 mg total) by mouth daily. 10/09/13   Reyne Dumas, MD  Fish Oil-Coenzyme Q10 (FISH OIL PLUS CO Q-10) 1000-30 MG CAPS Take 2 capsules by mouth daily.     [provider]  labetalol (NORMODYNE) 100 MG tablet Take 100 mg by mouth 2 (two) times daily.    [provider]  Liniments (SALONPAS PAIN RELIEF PATCH) PADS Apply 1 each topically daily as needed (for pain).    [provider]  loratadine (CLARITIN) 10 MG tablet Take 10 mg by mouth at bedtime.     [provider]  metFORMIN (GLUMETZA) 500 MG (MOD) 24 hr tablet Take 500 mg by mouth at bedtime.     [provider]  OVER THE COUNTER MEDICATION Apply 1 application topically daily as needed (for pain).    [provider]  pantoprazole (PROTONIX) 40 MG tablet Take 1 tablet (40 mg total)  by mouth daily. 10/09/13   Reyne Dumas, MD  predniSONE (DELTASONE) 5 MG tablet Take 3 tablets (15 mg total) by mouth daily with breakfast. 10/09/13   Reyne Dumas, MD  rosuvastatin (CRESTOR) 5 MG tablet Take 2.5 mg by mouth 4 (four) times a week. In PM    [provider]  traMADol (ULTRAM) 50 MG tablet Take 50 mg by mouth every 12 (twelve) hours as needed for moderate pain.    [provider]  valsartan-hydrochlorothiazide (DIOVAN-HCT) 320-25 MG per tablet Take 1 tablet by mouth daily.    [provider]    Family History Family History  Problem Relation Age of Onset  . Diabetes Mellitus II Sister   . Stroke Sister   . Stroke Brother   . Hypothyroidism Daughter     Social History Social History   Tobacco Use  . Smoking  status: Never Smoker  Substance Use Topics  . Alcohol use: No  . Drug use: Never     Allergies   Aspirin; Ibuprofen; and Nsaids   Review of Systems Review of Systems  All other systems reviewed and are negative.    Physical Exam Updated Vital Signs BP (!) 202/81 (BP Location: Right Arm)   Pulse 77   Temp 98 F (36.7 C) (Oral)   Resp 18   Ht 5\' 4"  (1.626 m)   Wt 68.5 kg   SpO2 97%   BMI 25.92 kg/m   Physical Exam Vitals signs and nursing note reviewed.    85 year old female, resting comfortably and in no acute distress. Vital signs are significant for elevated blood pressure. Oxygen saturation is 97%, which is normal. Head is normocephalic and atraumatic. PERRLA, EOMI. Oropharynx is clear. Neck is nontender and supple without adenopathy or JVD. Back is nontender and there is no CVA tenderness. Lungs are clear without rales, wheezes, or rhonchi. Chest is nontender. Heart has regular rate and rhythm without murmur. Abdomen is soft, flat, nontender without masses or hepatosplenomegaly and peristalsis is normoactive. Extremities have 1+ edema, full range of motion is present. Skin is warm and dry without rash. Neurologic: Mental status is normal, cranial nerves are intact, there are no motor or sensory deficits.  ED Treatments / Results  Labs (all labs ordered are listed, but only abnormal results are displayed) Labs Reviewed  BASIC METABOLIC PANEL - Abnormal; Notable for the following components:      Result Value   Sodium 133 (*)    Potassium 3.4 (*)    Chloride 97 (*)    Glucose, Bld 235 (*)    Creatinine, Ser 1.13 (*)    GFR calc non Af Amer 44 (*)    GFR calc Af Amer 51 (*)    All other components within normal limits  CBC - Abnormal; Notable for the following components:   WBC 11.7 (*)    Hemoglobin 11.0 (*)    HCT 35.1 (*)    All other components within normal limits  URINALYSIS, ROUTINE W REFLEX MICROSCOPIC - Abnormal; Notable for the following  components:   Leukocytes, UA MODERATE (*)    Non Squamous Epithelial 0-5 (*)    All other components within normal limits  URINE CULTURE  CBG MONITORING, ED    EKG EKG Interpretation  Date/Time:  Thursday February 20 2018 17:08:04 EST Ventricular Rate:  78 PR Interval:  170 QRS Duration: 82 QT Interval:  374 QTC Calculation: 426 R Axis:   -34 Text Interpretation:  Normal sinus rhythm Left  axis deviation ST abnormality, possible digitalis effect Abnormal QRS-T angle, consider primary T wave abnormality Abnormal ECG When compared with ECG of 10/08/2013, ST abnormality, possible digitalis effect is now present Confirmed by Delora Fuel ([id number]) on 02/20/2018 11:03:02 PM   Radiology Ct Head Wo Contrast  Result Date: 02/20/2018 CLINICAL DATA:  Fall with generalized weakness EXAM: CT HEAD WITHOUT CONTRAST TECHNIQUE: Contiguous axial images were obtained from the base of the skull through the vertex without intravenous contrast. COMPARISON:  MRI 10/09/2013 FINDINGS: Brain: No acute territorial infarction, hemorrhage, or intracranial mass is visualized. Moderate severe atrophy. Marked small vessel ischemic changes of the white matter. Encephalomalacia in the left parietal lobe. Stable ventricle size. Vascular: No hyperdense vessels. Carotid vascular and vertebral artery calcification Skull: Normal. Negative for fracture or focal lesion. Sinuses/Orbits: Mucosal thickening in the ethmoid and maxillary sinuses. No acute orbital abnormality Other: None IMPRESSION: 1. No CT evidence for acute intracranial abnormality. 2. Atrophy and small vessel ischemic changes of the white matter with chronic left parietal infarct Electronically Signed   By: Donavan Foil M.D.   On: 02/20/2018 18:10    Procedures Procedures   Medications Ordered in ED Medications  potassium chloride SA (K-DUR,KLOR-CON) CR tablet 40 mEq (40 mEq Oral Given 02/21/18 0211)     Initial Impression / Assessment and Plan / ED Course  I  have reviewed the triage vital signs and the nursing notes.  Pertinent labs & imaging results that were available during my care of the patient were reviewed by me and considered in my medical decision making (see chart for details).  Fall at home without obvious injury.  However, patient is on clopidogrel.  CT of head shows no acute process.  Family was advised of possibility of delayed bleeding in patient taking clopidogrel.  ECG does show some minor T wave changes.  She is noted to have mild hypokalemia on lab testing, and this is probably the reason for the T wave changes.  Mild anemia is present which is not significantly changed from baseline.  Urinalysis is pending.  Old records are reviewed, and she has no prior ED visits for falls.  Hospitalization in 2015 with presentation of weakness, diagnosed with polymyalgia rheumatica.  Urinalysis does not show evidence of UTI, but is sent for culture.  She is discharged with head injury precautions and strict return instructions.  Because of hypokalemia in setting of diuretic use, she is discharged with prescription for K. Dur.  Also, recommended follow-up with neurology for further evaluation of confusion.  Final Clinical Impressions(s) / ED Diagnoses   Final diagnoses:  Fall at home, initial encounter  Contusion of scalp, initial encounter  Diuretic-induced hypokalemia  Normochromic normocytic anemia    ED Discharge Orders         Ordered    potassium chloride (K-DUR) 10 MEQ tablet  2 times daily     02/21/18 8841           Delora Fuel, MD 66[birth year]/30 [phone]

## 2018-02-21 NOTE — Discharge Instructions (Signed)
Because you are taking clopidogrel, you are at increased risk for delayed bleeding in the brain following a fall.  If there are any neurologic symptoms whatsoever in the next 48 hours, return to the emergency department immediately.  Talk with your primary care provider about possibly switching off of gabapentin and/or tramadol since they may be playing a role in your confusion.  Your urine has been sent for culture.  If the culture shows infection, we will contact you to start you on antibiotics.

## 2018-02-22 LAB — URINE CULTURE: Culture: NO GROWTH

## 2018-02-28 DIAGNOSIS — R262 Difficulty in walking, not elsewhere classified: Secondary | ICD-10-CM | POA: Diagnosis not present

## 2018-02-28 DIAGNOSIS — K5900 Constipation, unspecified: Secondary | ICD-10-CM | POA: Diagnosis not present

## 2018-02-28 DIAGNOSIS — Z6828 Body mass index (BMI) 28.0-28.9, adult: Secondary | ICD-10-CM | POA: Diagnosis not present

## 2018-02-28 DIAGNOSIS — E876 Hypokalemia: Secondary | ICD-10-CM | POA: Diagnosis not present

## 2018-03-06 DIAGNOSIS — M25562 Pain in left knee: Secondary | ICD-10-CM | POA: Diagnosis not present

## 2018-03-06 DIAGNOSIS — I471 Supraventricular tachycardia: Secondary | ICD-10-CM | POA: Diagnosis not present

## 2018-03-06 DIAGNOSIS — I129 Hypertensive chronic kidney disease with stage 1 through stage 4 chronic kidney disease, or unspecified chronic kidney disease: Secondary | ICD-10-CM | POA: Diagnosis not present

## 2018-03-06 DIAGNOSIS — E1140 Type 2 diabetes mellitus with diabetic neuropathy, unspecified: Secondary | ICD-10-CM | POA: Diagnosis not present

## 2018-03-06 DIAGNOSIS — E1122 Type 2 diabetes mellitus with diabetic chronic kidney disease: Secondary | ICD-10-CM | POA: Diagnosis not present

## 2018-03-06 DIAGNOSIS — N189 Chronic kidney disease, unspecified: Secondary | ICD-10-CM | POA: Diagnosis not present

## 2018-03-06 DIAGNOSIS — E785 Hyperlipidemia, unspecified: Secondary | ICD-10-CM | POA: Diagnosis not present

## 2018-03-06 DIAGNOSIS — M353 Polymyalgia rheumatica: Secondary | ICD-10-CM | POA: Diagnosis not present

## 2018-03-06 DIAGNOSIS — M1991 Primary osteoarthritis, unspecified site: Secondary | ICD-10-CM | POA: Diagnosis not present

## 2018-03-10 DIAGNOSIS — M25562 Pain in left knee: Secondary | ICD-10-CM | POA: Diagnosis not present

## 2018-03-10 DIAGNOSIS — E785 Hyperlipidemia, unspecified: Secondary | ICD-10-CM | POA: Diagnosis not present

## 2018-03-10 DIAGNOSIS — E1122 Type 2 diabetes mellitus with diabetic chronic kidney disease: Secondary | ICD-10-CM | POA: Diagnosis not present

## 2018-03-10 DIAGNOSIS — I129 Hypertensive chronic kidney disease with stage 1 through stage 4 chronic kidney disease, or unspecified chronic kidney disease: Secondary | ICD-10-CM | POA: Diagnosis not present

## 2018-03-10 DIAGNOSIS — E1140 Type 2 diabetes mellitus with diabetic neuropathy, unspecified: Secondary | ICD-10-CM | POA: Diagnosis not present

## 2018-03-10 DIAGNOSIS — M1991 Primary osteoarthritis, unspecified site: Secondary | ICD-10-CM | POA: Diagnosis not present

## 2018-03-10 DIAGNOSIS — I471 Supraventricular tachycardia: Secondary | ICD-10-CM | POA: Diagnosis not present

## 2018-03-10 DIAGNOSIS — N189 Chronic kidney disease, unspecified: Secondary | ICD-10-CM | POA: Diagnosis not present

## 2018-03-10 DIAGNOSIS — M353 Polymyalgia rheumatica: Secondary | ICD-10-CM | POA: Diagnosis not present

## 2018-03-11 DIAGNOSIS — M25562 Pain in left knee: Secondary | ICD-10-CM | POA: Diagnosis not present

## 2018-03-11 DIAGNOSIS — I129 Hypertensive chronic kidney disease with stage 1 through stage 4 chronic kidney disease, or unspecified chronic kidney disease: Secondary | ICD-10-CM | POA: Diagnosis not present

## 2018-03-11 DIAGNOSIS — M1991 Primary osteoarthritis, unspecified site: Secondary | ICD-10-CM | POA: Diagnosis not present

## 2018-03-11 DIAGNOSIS — I471 Supraventricular tachycardia: Secondary | ICD-10-CM | POA: Diagnosis not present

## 2018-03-11 DIAGNOSIS — N189 Chronic kidney disease, unspecified: Secondary | ICD-10-CM | POA: Diagnosis not present

## 2018-03-11 DIAGNOSIS — M353 Polymyalgia rheumatica: Secondary | ICD-10-CM | POA: Diagnosis not present

## 2018-03-11 DIAGNOSIS — E785 Hyperlipidemia, unspecified: Secondary | ICD-10-CM | POA: Diagnosis not present

## 2018-03-11 DIAGNOSIS — E1140 Type 2 diabetes mellitus with diabetic neuropathy, unspecified: Secondary | ICD-10-CM | POA: Diagnosis not present

## 2018-03-11 DIAGNOSIS — E1122 Type 2 diabetes mellitus with diabetic chronic kidney disease: Secondary | ICD-10-CM | POA: Diagnosis not present

## 2018-03-14 DIAGNOSIS — M25562 Pain in left knee: Secondary | ICD-10-CM | POA: Diagnosis not present

## 2018-03-14 DIAGNOSIS — I471 Supraventricular tachycardia: Secondary | ICD-10-CM | POA: Diagnosis not present

## 2018-03-14 DIAGNOSIS — M1991 Primary osteoarthritis, unspecified site: Secondary | ICD-10-CM | POA: Diagnosis not present

## 2018-03-14 DIAGNOSIS — E1140 Type 2 diabetes mellitus with diabetic neuropathy, unspecified: Secondary | ICD-10-CM | POA: Diagnosis not present

## 2018-03-14 DIAGNOSIS — N189 Chronic kidney disease, unspecified: Secondary | ICD-10-CM | POA: Diagnosis not present

## 2018-03-14 DIAGNOSIS — E785 Hyperlipidemia, unspecified: Secondary | ICD-10-CM | POA: Diagnosis not present

## 2018-03-14 DIAGNOSIS — M353 Polymyalgia rheumatica: Secondary | ICD-10-CM | POA: Diagnosis not present

## 2018-03-14 DIAGNOSIS — E1122 Type 2 diabetes mellitus with diabetic chronic kidney disease: Secondary | ICD-10-CM | POA: Diagnosis not present

## 2018-03-14 DIAGNOSIS — I129 Hypertensive chronic kidney disease with stage 1 through stage 4 chronic kidney disease, or unspecified chronic kidney disease: Secondary | ICD-10-CM | POA: Diagnosis not present

## 2018-03-17 DIAGNOSIS — M25562 Pain in left knee: Secondary | ICD-10-CM | POA: Diagnosis not present

## 2018-03-17 DIAGNOSIS — M1991 Primary osteoarthritis, unspecified site: Secondary | ICD-10-CM | POA: Diagnosis not present

## 2018-03-17 DIAGNOSIS — E1122 Type 2 diabetes mellitus with diabetic chronic kidney disease: Secondary | ICD-10-CM | POA: Diagnosis not present

## 2018-03-17 DIAGNOSIS — M353 Polymyalgia rheumatica: Secondary | ICD-10-CM | POA: Diagnosis not present

## 2018-03-17 DIAGNOSIS — N189 Chronic kidney disease, unspecified: Secondary | ICD-10-CM | POA: Diagnosis not present

## 2018-03-17 DIAGNOSIS — I471 Supraventricular tachycardia: Secondary | ICD-10-CM | POA: Diagnosis not present

## 2018-03-17 DIAGNOSIS — E785 Hyperlipidemia, unspecified: Secondary | ICD-10-CM | POA: Diagnosis not present

## 2018-03-17 DIAGNOSIS — I129 Hypertensive chronic kidney disease with stage 1 through stage 4 chronic kidney disease, or unspecified chronic kidney disease: Secondary | ICD-10-CM | POA: Diagnosis not present

## 2018-03-17 DIAGNOSIS — E1140 Type 2 diabetes mellitus with diabetic neuropathy, unspecified: Secondary | ICD-10-CM | POA: Diagnosis not present

## 2018-03-18 DIAGNOSIS — E785 Hyperlipidemia, unspecified: Secondary | ICD-10-CM | POA: Diagnosis not present

## 2018-03-18 DIAGNOSIS — I471 Supraventricular tachycardia: Secondary | ICD-10-CM | POA: Diagnosis not present

## 2018-03-18 DIAGNOSIS — M1991 Primary osteoarthritis, unspecified site: Secondary | ICD-10-CM | POA: Diagnosis not present

## 2018-03-18 DIAGNOSIS — N189 Chronic kidney disease, unspecified: Secondary | ICD-10-CM | POA: Diagnosis not present

## 2018-03-18 DIAGNOSIS — I129 Hypertensive chronic kidney disease with stage 1 through stage 4 chronic kidney disease, or unspecified chronic kidney disease: Secondary | ICD-10-CM | POA: Diagnosis not present

## 2018-03-18 DIAGNOSIS — M353 Polymyalgia rheumatica: Secondary | ICD-10-CM | POA: Diagnosis not present

## 2018-03-18 DIAGNOSIS — E1140 Type 2 diabetes mellitus with diabetic neuropathy, unspecified: Secondary | ICD-10-CM | POA: Diagnosis not present

## 2018-03-18 DIAGNOSIS — E1122 Type 2 diabetes mellitus with diabetic chronic kidney disease: Secondary | ICD-10-CM | POA: Diagnosis not present

## 2018-03-18 DIAGNOSIS — M25562 Pain in left knee: Secondary | ICD-10-CM | POA: Diagnosis not present

## 2018-03-20 DIAGNOSIS — E1122 Type 2 diabetes mellitus with diabetic chronic kidney disease: Secondary | ICD-10-CM | POA: Diagnosis not present

## 2018-03-20 DIAGNOSIS — E1140 Type 2 diabetes mellitus with diabetic neuropathy, unspecified: Secondary | ICD-10-CM | POA: Diagnosis not present

## 2018-03-20 DIAGNOSIS — E785 Hyperlipidemia, unspecified: Secondary | ICD-10-CM | POA: Diagnosis not present

## 2018-03-20 DIAGNOSIS — I129 Hypertensive chronic kidney disease with stage 1 through stage 4 chronic kidney disease, or unspecified chronic kidney disease: Secondary | ICD-10-CM | POA: Diagnosis not present

## 2018-03-20 DIAGNOSIS — N189 Chronic kidney disease, unspecified: Secondary | ICD-10-CM | POA: Diagnosis not present

## 2018-03-20 DIAGNOSIS — M1991 Primary osteoarthritis, unspecified site: Secondary | ICD-10-CM | POA: Diagnosis not present

## 2018-03-20 DIAGNOSIS — M353 Polymyalgia rheumatica: Secondary | ICD-10-CM | POA: Diagnosis not present

## 2018-03-20 DIAGNOSIS — M25562 Pain in left knee: Secondary | ICD-10-CM | POA: Diagnosis not present

## 2018-03-20 DIAGNOSIS — I471 Supraventricular tachycardia: Secondary | ICD-10-CM | POA: Diagnosis not present

## 2018-03-21 DIAGNOSIS — M1712 Unilateral primary osteoarthritis, left knee: Secondary | ICD-10-CM | POA: Diagnosis not present

## 2018-03-24 DIAGNOSIS — M1991 Primary osteoarthritis, unspecified site: Secondary | ICD-10-CM | POA: Diagnosis not present

## 2018-03-24 DIAGNOSIS — I471 Supraventricular tachycardia: Secondary | ICD-10-CM | POA: Diagnosis not present

## 2018-03-24 DIAGNOSIS — M353 Polymyalgia rheumatica: Secondary | ICD-10-CM | POA: Diagnosis not present

## 2018-03-24 DIAGNOSIS — E785 Hyperlipidemia, unspecified: Secondary | ICD-10-CM | POA: Diagnosis not present

## 2018-03-24 DIAGNOSIS — E1122 Type 2 diabetes mellitus with diabetic chronic kidney disease: Secondary | ICD-10-CM | POA: Diagnosis not present

## 2018-03-24 DIAGNOSIS — N189 Chronic kidney disease, unspecified: Secondary | ICD-10-CM | POA: Diagnosis not present

## 2018-03-24 DIAGNOSIS — M25562 Pain in left knee: Secondary | ICD-10-CM | POA: Diagnosis not present

## 2018-03-24 DIAGNOSIS — I129 Hypertensive chronic kidney disease with stage 1 through stage 4 chronic kidney disease, or unspecified chronic kidney disease: Secondary | ICD-10-CM | POA: Diagnosis not present

## 2018-03-24 DIAGNOSIS — E1140 Type 2 diabetes mellitus with diabetic neuropathy, unspecified: Secondary | ICD-10-CM | POA: Diagnosis not present

## 2018-03-25 DIAGNOSIS — I129 Hypertensive chronic kidney disease with stage 1 through stage 4 chronic kidney disease, or unspecified chronic kidney disease: Secondary | ICD-10-CM | POA: Diagnosis not present

## 2018-03-25 DIAGNOSIS — M1991 Primary osteoarthritis, unspecified site: Secondary | ICD-10-CM | POA: Diagnosis not present

## 2018-03-25 DIAGNOSIS — M353 Polymyalgia rheumatica: Secondary | ICD-10-CM | POA: Diagnosis not present

## 2018-03-25 DIAGNOSIS — E785 Hyperlipidemia, unspecified: Secondary | ICD-10-CM | POA: Diagnosis not present

## 2018-03-25 DIAGNOSIS — Z6828 Body mass index (BMI) 28.0-28.9, adult: Secondary | ICD-10-CM | POA: Diagnosis not present

## 2018-03-25 DIAGNOSIS — I471 Supraventricular tachycardia: Secondary | ICD-10-CM | POA: Diagnosis not present

## 2018-03-25 DIAGNOSIS — N189 Chronic kidney disease, unspecified: Secondary | ICD-10-CM | POA: Diagnosis not present

## 2018-03-25 DIAGNOSIS — E1122 Type 2 diabetes mellitus with diabetic chronic kidney disease: Secondary | ICD-10-CM | POA: Diagnosis not present

## 2018-03-25 DIAGNOSIS — I10 Essential (primary) hypertension: Secondary | ICD-10-CM | POA: Diagnosis not present

## 2018-03-25 DIAGNOSIS — M25562 Pain in left knee: Secondary | ICD-10-CM | POA: Diagnosis not present

## 2018-03-25 DIAGNOSIS — E1140 Type 2 diabetes mellitus with diabetic neuropathy, unspecified: Secondary | ICD-10-CM | POA: Diagnosis not present

## 2018-03-27 DIAGNOSIS — E1140 Type 2 diabetes mellitus with diabetic neuropathy, unspecified: Secondary | ICD-10-CM | POA: Diagnosis not present

## 2018-03-27 DIAGNOSIS — N189 Chronic kidney disease, unspecified: Secondary | ICD-10-CM | POA: Diagnosis not present

## 2018-03-27 DIAGNOSIS — M25562 Pain in left knee: Secondary | ICD-10-CM | POA: Diagnosis not present

## 2018-03-27 DIAGNOSIS — I129 Hypertensive chronic kidney disease with stage 1 through stage 4 chronic kidney disease, or unspecified chronic kidney disease: Secondary | ICD-10-CM | POA: Diagnosis not present

## 2018-03-27 DIAGNOSIS — E785 Hyperlipidemia, unspecified: Secondary | ICD-10-CM | POA: Diagnosis not present

## 2018-03-27 DIAGNOSIS — I471 Supraventricular tachycardia: Secondary | ICD-10-CM | POA: Diagnosis not present

## 2018-03-27 DIAGNOSIS — M1991 Primary osteoarthritis, unspecified site: Secondary | ICD-10-CM | POA: Diagnosis not present

## 2018-03-27 DIAGNOSIS — M353 Polymyalgia rheumatica: Secondary | ICD-10-CM | POA: Diagnosis not present

## 2018-03-27 DIAGNOSIS — E1122 Type 2 diabetes mellitus with diabetic chronic kidney disease: Secondary | ICD-10-CM | POA: Diagnosis not present

## 2018-03-31 DIAGNOSIS — E1140 Type 2 diabetes mellitus with diabetic neuropathy, unspecified: Secondary | ICD-10-CM | POA: Diagnosis not present

## 2018-03-31 DIAGNOSIS — I471 Supraventricular tachycardia: Secondary | ICD-10-CM | POA: Diagnosis not present

## 2018-03-31 DIAGNOSIS — E1122 Type 2 diabetes mellitus with diabetic chronic kidney disease: Secondary | ICD-10-CM | POA: Diagnosis not present

## 2018-03-31 DIAGNOSIS — M25562 Pain in left knee: Secondary | ICD-10-CM | POA: Diagnosis not present

## 2018-03-31 DIAGNOSIS — N189 Chronic kidney disease, unspecified: Secondary | ICD-10-CM | POA: Diagnosis not present

## 2018-03-31 DIAGNOSIS — E785 Hyperlipidemia, unspecified: Secondary | ICD-10-CM | POA: Diagnosis not present

## 2018-03-31 DIAGNOSIS — M353 Polymyalgia rheumatica: Secondary | ICD-10-CM | POA: Diagnosis not present

## 2018-03-31 DIAGNOSIS — M1991 Primary osteoarthritis, unspecified site: Secondary | ICD-10-CM | POA: Diagnosis not present

## 2018-03-31 DIAGNOSIS — I129 Hypertensive chronic kidney disease with stage 1 through stage 4 chronic kidney disease, or unspecified chronic kidney disease: Secondary | ICD-10-CM | POA: Diagnosis not present

## 2018-04-01 DIAGNOSIS — E1122 Type 2 diabetes mellitus with diabetic chronic kidney disease: Secondary | ICD-10-CM | POA: Diagnosis not present

## 2018-04-01 DIAGNOSIS — M353 Polymyalgia rheumatica: Secondary | ICD-10-CM | POA: Diagnosis not present

## 2018-04-01 DIAGNOSIS — E785 Hyperlipidemia, unspecified: Secondary | ICD-10-CM | POA: Diagnosis not present

## 2018-04-01 DIAGNOSIS — I471 Supraventricular tachycardia: Secondary | ICD-10-CM | POA: Diagnosis not present

## 2018-04-01 DIAGNOSIS — I129 Hypertensive chronic kidney disease with stage 1 through stage 4 chronic kidney disease, or unspecified chronic kidney disease: Secondary | ICD-10-CM | POA: Diagnosis not present

## 2018-04-01 DIAGNOSIS — M25562 Pain in left knee: Secondary | ICD-10-CM | POA: Diagnosis not present

## 2018-04-01 DIAGNOSIS — M1991 Primary osteoarthritis, unspecified site: Secondary | ICD-10-CM | POA: Diagnosis not present

## 2018-04-01 DIAGNOSIS — E1140 Type 2 diabetes mellitus with diabetic neuropathy, unspecified: Secondary | ICD-10-CM | POA: Diagnosis not present

## 2018-04-01 DIAGNOSIS — N189 Chronic kidney disease, unspecified: Secondary | ICD-10-CM | POA: Diagnosis not present

## 2018-04-03 DIAGNOSIS — M1991 Primary osteoarthritis, unspecified site: Secondary | ICD-10-CM | POA: Diagnosis not present

## 2018-04-03 DIAGNOSIS — E1122 Type 2 diabetes mellitus with diabetic chronic kidney disease: Secondary | ICD-10-CM | POA: Diagnosis not present

## 2018-04-03 DIAGNOSIS — I471 Supraventricular tachycardia: Secondary | ICD-10-CM | POA: Diagnosis not present

## 2018-04-03 DIAGNOSIS — M25562 Pain in left knee: Secondary | ICD-10-CM | POA: Diagnosis not present

## 2018-04-03 DIAGNOSIS — N189 Chronic kidney disease, unspecified: Secondary | ICD-10-CM | POA: Diagnosis not present

## 2018-04-03 DIAGNOSIS — E1140 Type 2 diabetes mellitus with diabetic neuropathy, unspecified: Secondary | ICD-10-CM | POA: Diagnosis not present

## 2018-04-03 DIAGNOSIS — I129 Hypertensive chronic kidney disease with stage 1 through stage 4 chronic kidney disease, or unspecified chronic kidney disease: Secondary | ICD-10-CM | POA: Diagnosis not present

## 2018-04-03 DIAGNOSIS — M353 Polymyalgia rheumatica: Secondary | ICD-10-CM | POA: Diagnosis not present

## 2018-04-03 DIAGNOSIS — E785 Hyperlipidemia, unspecified: Secondary | ICD-10-CM | POA: Diagnosis not present

## 2018-04-07 DIAGNOSIS — I10 Essential (primary) hypertension: Secondary | ICD-10-CM | POA: Diagnosis not present

## 2018-04-07 DIAGNOSIS — R252 Cramp and spasm: Secondary | ICD-10-CM | POA: Diagnosis not present

## 2018-04-07 DIAGNOSIS — E1140 Type 2 diabetes mellitus with diabetic neuropathy, unspecified: Secondary | ICD-10-CM | POA: Diagnosis not present

## 2018-04-07 DIAGNOSIS — E1122 Type 2 diabetes mellitus with diabetic chronic kidney disease: Secondary | ICD-10-CM | POA: Diagnosis not present

## 2018-04-07 DIAGNOSIS — M1990 Unspecified osteoarthritis, unspecified site: Secondary | ICD-10-CM | POA: Diagnosis not present

## 2018-04-07 DIAGNOSIS — E079 Disorder of thyroid, unspecified: Secondary | ICD-10-CM | POA: Diagnosis not present

## 2018-04-07 DIAGNOSIS — Z6828 Body mass index (BMI) 28.0-28.9, adult: Secondary | ICD-10-CM | POA: Diagnosis not present

## 2018-04-21 DIAGNOSIS — H1013 Acute atopic conjunctivitis, bilateral: Secondary | ICD-10-CM | POA: Diagnosis not present

## 2018-05-06 DIAGNOSIS — I10 Essential (primary) hypertension: Secondary | ICD-10-CM | POA: Diagnosis not present

## 2018-05-06 DIAGNOSIS — Z6828 Body mass index (BMI) 28.0-28.9, adult: Secondary | ICD-10-CM | POA: Diagnosis not present

## 2018-05-06 DIAGNOSIS — R600 Localized edema: Secondary | ICD-10-CM | POA: Diagnosis not present

## 2018-05-06 DIAGNOSIS — G2581 Restless legs syndrome: Secondary | ICD-10-CM | POA: Diagnosis not present

## 2018-05-06 DIAGNOSIS — E663 Overweight: Secondary | ICD-10-CM | POA: Diagnosis not present

## 2018-06-17 DIAGNOSIS — Z9181 History of falling: Secondary | ICD-10-CM | POA: Diagnosis not present

## 2018-06-17 DIAGNOSIS — Z1231 Encounter for screening mammogram for malignant neoplasm of breast: Secondary | ICD-10-CM | POA: Diagnosis not present

## 2018-06-17 DIAGNOSIS — Z136 Encounter for screening for cardiovascular disorders: Secondary | ICD-10-CM | POA: Diagnosis not present

## 2018-06-17 DIAGNOSIS — E785 Hyperlipidemia, unspecified: Secondary | ICD-10-CM | POA: Diagnosis not present

## 2018-06-17 DIAGNOSIS — Z1331 Encounter for screening for depression: Secondary | ICD-10-CM | POA: Diagnosis not present

## 2018-06-17 DIAGNOSIS — Z0000 Encounter for general adult medical examination without abnormal findings: Secondary | ICD-10-CM | POA: Diagnosis not present

## 2018-07-11 DIAGNOSIS — I10 Essential (primary) hypertension: Secondary | ICD-10-CM | POA: Diagnosis not present

## 2018-07-11 DIAGNOSIS — Z6828 Body mass index (BMI) 28.0-28.9, adult: Secondary | ICD-10-CM | POA: Diagnosis not present

## 2018-07-11 DIAGNOSIS — E1122 Type 2 diabetes mellitus with diabetic chronic kidney disease: Secondary | ICD-10-CM | POA: Diagnosis not present

## 2018-07-11 DIAGNOSIS — E1140 Type 2 diabetes mellitus with diabetic neuropathy, unspecified: Secondary | ICD-10-CM | POA: Diagnosis not present

## 2018-07-11 DIAGNOSIS — M1990 Unspecified osteoarthritis, unspecified site: Secondary | ICD-10-CM | POA: Diagnosis not present

## 2018-07-11 DIAGNOSIS — E785 Hyperlipidemia, unspecified: Secondary | ICD-10-CM | POA: Diagnosis not present

## 2018-07-11 DIAGNOSIS — G2581 Restless legs syndrome: Secondary | ICD-10-CM | POA: Diagnosis not present

## 2018-07-22 DIAGNOSIS — M1712 Unilateral primary osteoarthritis, left knee: Secondary | ICD-10-CM | POA: Diagnosis not present

## 2018-08-15 DIAGNOSIS — K1379 Other lesions of oral mucosa: Secondary | ICD-10-CM | POA: Diagnosis not present

## 2018-08-15 DIAGNOSIS — I10 Essential (primary) hypertension: Secondary | ICD-10-CM | POA: Diagnosis not present

## 2018-08-15 DIAGNOSIS — K219 Gastro-esophageal reflux disease without esophagitis: Secondary | ICD-10-CM | POA: Diagnosis not present

## 2018-08-15 DIAGNOSIS — Z6828 Body mass index (BMI) 28.0-28.9, adult: Secondary | ICD-10-CM | POA: Diagnosis not present

## 2018-09-15 DIAGNOSIS — Z1231 Encounter for screening mammogram for malignant neoplasm of breast: Secondary | ICD-10-CM | POA: Diagnosis not present

## 2018-09-17 DIAGNOSIS — M1712 Unilateral primary osteoarthritis, left knee: Secondary | ICD-10-CM | POA: Diagnosis not present

## 2018-09-30 DIAGNOSIS — Z6828 Body mass index (BMI) 28.0-28.9, adult: Secondary | ICD-10-CM | POA: Diagnosis not present

## 2018-09-30 DIAGNOSIS — I10 Essential (primary) hypertension: Secondary | ICD-10-CM | POA: Diagnosis not present

## 2018-09-30 DIAGNOSIS — R600 Localized edema: Secondary | ICD-10-CM | POA: Diagnosis not present

## 2018-09-30 DIAGNOSIS — R531 Weakness: Secondary | ICD-10-CM | POA: Diagnosis not present

## 2018-09-30 DIAGNOSIS — T07XXXA Unspecified multiple injuries, initial encounter: Secondary | ICD-10-CM | POA: Diagnosis not present

## 2018-10-03 DIAGNOSIS — M1712 Unilateral primary osteoarthritis, left knee: Secondary | ICD-10-CM | POA: Diagnosis not present

## 2018-10-31 DIAGNOSIS — M1712 Unilateral primary osteoarthritis, left knee: Secondary | ICD-10-CM | POA: Diagnosis not present

## 2018-11-07 DIAGNOSIS — Z961 Presence of intraocular lens: Secondary | ICD-10-CM | POA: Diagnosis not present

## 2018-11-17 DIAGNOSIS — E1140 Type 2 diabetes mellitus with diabetic neuropathy, unspecified: Secondary | ICD-10-CM | POA: Diagnosis not present

## 2018-11-17 DIAGNOSIS — K219 Gastro-esophageal reflux disease without esophagitis: Secondary | ICD-10-CM | POA: Diagnosis not present

## 2018-11-17 DIAGNOSIS — Z23 Encounter for immunization: Secondary | ICD-10-CM | POA: Diagnosis not present

## 2018-11-17 DIAGNOSIS — Z139 Encounter for screening, unspecified: Secondary | ICD-10-CM | POA: Diagnosis not present

## 2018-11-17 DIAGNOSIS — R600 Localized edema: Secondary | ICD-10-CM | POA: Diagnosis not present

## 2018-11-17 DIAGNOSIS — I10 Essential (primary) hypertension: Secondary | ICD-10-CM | POA: Diagnosis not present

## 2018-11-17 DIAGNOSIS — R531 Weakness: Secondary | ICD-10-CM | POA: Diagnosis not present

## 2018-11-17 DIAGNOSIS — E1122 Type 2 diabetes mellitus with diabetic chronic kidney disease: Secondary | ICD-10-CM | POA: Diagnosis not present

## 2018-11-17 DIAGNOSIS — D649 Anemia, unspecified: Secondary | ICD-10-CM | POA: Diagnosis not present

## 2018-11-17 DIAGNOSIS — Z6829 Body mass index (BMI) 29.0-29.9, adult: Secondary | ICD-10-CM | POA: Diagnosis not present

## 2018-12-29 DIAGNOSIS — I493 Ventricular premature depolarization: Secondary | ICD-10-CM | POA: Diagnosis not present

## 2018-12-29 DIAGNOSIS — E785 Hyperlipidemia, unspecified: Secondary | ICD-10-CM | POA: Diagnosis not present

## 2018-12-29 DIAGNOSIS — Z8679 Personal history of other diseases of the circulatory system: Secondary | ICD-10-CM | POA: Diagnosis not present

## 2018-12-29 DIAGNOSIS — I10 Essential (primary) hypertension: Secondary | ICD-10-CM | POA: Diagnosis not present

## 2018-12-29 DIAGNOSIS — I2510 Atherosclerotic heart disease of native coronary artery without angina pectoris: Secondary | ICD-10-CM | POA: Diagnosis not present

## 2019-01-02 DIAGNOSIS — Z6829 Body mass index (BMI) 29.0-29.9, adult: Secondary | ICD-10-CM | POA: Diagnosis not present

## 2019-01-02 DIAGNOSIS — I10 Essential (primary) hypertension: Secondary | ICD-10-CM | POA: Diagnosis not present

## 2019-01-02 DIAGNOSIS — M1712 Unilateral primary osteoarthritis, left knee: Secondary | ICD-10-CM | POA: Diagnosis not present

## 2019-01-02 DIAGNOSIS — D649 Anemia, unspecified: Secondary | ICD-10-CM | POA: Diagnosis not present

## 2019-01-02 DIAGNOSIS — M545 Low back pain: Secondary | ICD-10-CM | POA: Diagnosis not present

## 2019-01-06 DIAGNOSIS — D631 Anemia in chronic kidney disease: Secondary | ICD-10-CM | POA: Diagnosis not present

## 2019-01-06 DIAGNOSIS — I471 Supraventricular tachycardia: Secondary | ICD-10-CM | POA: Diagnosis not present

## 2019-01-06 DIAGNOSIS — I129 Hypertensive chronic kidney disease with stage 1 through stage 4 chronic kidney disease, or unspecified chronic kidney disease: Secondary | ICD-10-CM | POA: Diagnosis not present

## 2019-01-06 DIAGNOSIS — N189 Chronic kidney disease, unspecified: Secondary | ICD-10-CM | POA: Diagnosis not present

## 2019-01-06 DIAGNOSIS — E1122 Type 2 diabetes mellitus with diabetic chronic kidney disease: Secondary | ICD-10-CM | POA: Diagnosis not present

## 2019-01-06 DIAGNOSIS — M353 Polymyalgia rheumatica: Secondary | ICD-10-CM | POA: Diagnosis not present

## 2019-01-06 DIAGNOSIS — E1140 Type 2 diabetes mellitus with diabetic neuropathy, unspecified: Secondary | ICD-10-CM | POA: Diagnosis not present

## 2019-01-06 DIAGNOSIS — M5432 Sciatica, left side: Secondary | ICD-10-CM | POA: Diagnosis not present

## 2019-01-06 DIAGNOSIS — M1990 Unspecified osteoarthritis, unspecified site: Secondary | ICD-10-CM | POA: Diagnosis not present

## 2019-01-09 DIAGNOSIS — N189 Chronic kidney disease, unspecified: Secondary | ICD-10-CM | POA: Diagnosis not present

## 2019-01-09 DIAGNOSIS — M5432 Sciatica, left side: Secondary | ICD-10-CM | POA: Diagnosis not present

## 2019-01-09 DIAGNOSIS — M1990 Unspecified osteoarthritis, unspecified site: Secondary | ICD-10-CM | POA: Diagnosis not present

## 2019-01-09 DIAGNOSIS — M353 Polymyalgia rheumatica: Secondary | ICD-10-CM | POA: Diagnosis not present

## 2019-01-09 DIAGNOSIS — E1122 Type 2 diabetes mellitus with diabetic chronic kidney disease: Secondary | ICD-10-CM | POA: Diagnosis not present

## 2019-01-09 DIAGNOSIS — D631 Anemia in chronic kidney disease: Secondary | ICD-10-CM | POA: Diagnosis not present

## 2019-01-09 DIAGNOSIS — I129 Hypertensive chronic kidney disease with stage 1 through stage 4 chronic kidney disease, or unspecified chronic kidney disease: Secondary | ICD-10-CM | POA: Diagnosis not present

## 2019-01-09 DIAGNOSIS — E1140 Type 2 diabetes mellitus with diabetic neuropathy, unspecified: Secondary | ICD-10-CM | POA: Diagnosis not present

## 2019-01-09 DIAGNOSIS — I471 Supraventricular tachycardia: Secondary | ICD-10-CM | POA: Diagnosis not present

## 2019-01-12 DIAGNOSIS — I129 Hypertensive chronic kidney disease with stage 1 through stage 4 chronic kidney disease, or unspecified chronic kidney disease: Secondary | ICD-10-CM | POA: Diagnosis not present

## 2019-01-12 DIAGNOSIS — M5432 Sciatica, left side: Secondary | ICD-10-CM | POA: Diagnosis not present

## 2019-01-12 DIAGNOSIS — E1122 Type 2 diabetes mellitus with diabetic chronic kidney disease: Secondary | ICD-10-CM | POA: Diagnosis not present

## 2019-01-12 DIAGNOSIS — E1140 Type 2 diabetes mellitus with diabetic neuropathy, unspecified: Secondary | ICD-10-CM | POA: Diagnosis not present

## 2019-01-12 DIAGNOSIS — N189 Chronic kidney disease, unspecified: Secondary | ICD-10-CM | POA: Diagnosis not present

## 2019-01-12 DIAGNOSIS — I471 Supraventricular tachycardia: Secondary | ICD-10-CM | POA: Diagnosis not present

## 2019-01-12 DIAGNOSIS — M353 Polymyalgia rheumatica: Secondary | ICD-10-CM | POA: Diagnosis not present

## 2019-01-12 DIAGNOSIS — D631 Anemia in chronic kidney disease: Secondary | ICD-10-CM | POA: Diagnosis not present

## 2019-01-12 DIAGNOSIS — M1990 Unspecified osteoarthritis, unspecified site: Secondary | ICD-10-CM | POA: Diagnosis not present

## 2019-01-20 DIAGNOSIS — M353 Polymyalgia rheumatica: Secondary | ICD-10-CM | POA: Diagnosis not present

## 2019-01-20 DIAGNOSIS — E1140 Type 2 diabetes mellitus with diabetic neuropathy, unspecified: Secondary | ICD-10-CM | POA: Diagnosis not present

## 2019-01-20 DIAGNOSIS — E1122 Type 2 diabetes mellitus with diabetic chronic kidney disease: Secondary | ICD-10-CM | POA: Diagnosis not present

## 2019-01-20 DIAGNOSIS — M5432 Sciatica, left side: Secondary | ICD-10-CM | POA: Diagnosis not present

## 2019-01-20 DIAGNOSIS — M1990 Unspecified osteoarthritis, unspecified site: Secondary | ICD-10-CM | POA: Diagnosis not present

## 2019-01-20 DIAGNOSIS — N189 Chronic kidney disease, unspecified: Secondary | ICD-10-CM | POA: Diagnosis not present

## 2019-01-20 DIAGNOSIS — D631 Anemia in chronic kidney disease: Secondary | ICD-10-CM | POA: Diagnosis not present

## 2019-01-20 DIAGNOSIS — I129 Hypertensive chronic kidney disease with stage 1 through stage 4 chronic kidney disease, or unspecified chronic kidney disease: Secondary | ICD-10-CM | POA: Diagnosis not present

## 2019-01-20 DIAGNOSIS — I471 Supraventricular tachycardia: Secondary | ICD-10-CM | POA: Diagnosis not present

## 2019-01-22 DIAGNOSIS — I129 Hypertensive chronic kidney disease with stage 1 through stage 4 chronic kidney disease, or unspecified chronic kidney disease: Secondary | ICD-10-CM | POA: Diagnosis not present

## 2019-01-22 DIAGNOSIS — M1990 Unspecified osteoarthritis, unspecified site: Secondary | ICD-10-CM | POA: Diagnosis not present

## 2019-01-22 DIAGNOSIS — M5432 Sciatica, left side: Secondary | ICD-10-CM | POA: Diagnosis not present

## 2019-01-22 DIAGNOSIS — I471 Supraventricular tachycardia: Secondary | ICD-10-CM | POA: Diagnosis not present

## 2019-01-22 DIAGNOSIS — M353 Polymyalgia rheumatica: Secondary | ICD-10-CM | POA: Diagnosis not present

## 2019-01-22 DIAGNOSIS — D631 Anemia in chronic kidney disease: Secondary | ICD-10-CM | POA: Diagnosis not present

## 2019-01-22 DIAGNOSIS — E1122 Type 2 diabetes mellitus with diabetic chronic kidney disease: Secondary | ICD-10-CM | POA: Diagnosis not present

## 2019-01-22 DIAGNOSIS — E1140 Type 2 diabetes mellitus with diabetic neuropathy, unspecified: Secondary | ICD-10-CM | POA: Diagnosis not present

## 2019-01-22 DIAGNOSIS — N189 Chronic kidney disease, unspecified: Secondary | ICD-10-CM | POA: Diagnosis not present

## 2019-01-27 DIAGNOSIS — D631 Anemia in chronic kidney disease: Secondary | ICD-10-CM | POA: Diagnosis not present

## 2019-01-27 DIAGNOSIS — I471 Supraventricular tachycardia: Secondary | ICD-10-CM | POA: Diagnosis not present

## 2019-01-27 DIAGNOSIS — E1122 Type 2 diabetes mellitus with diabetic chronic kidney disease: Secondary | ICD-10-CM | POA: Diagnosis not present

## 2019-01-27 DIAGNOSIS — N189 Chronic kidney disease, unspecified: Secondary | ICD-10-CM | POA: Diagnosis not present

## 2019-01-27 DIAGNOSIS — M5432 Sciatica, left side: Secondary | ICD-10-CM | POA: Diagnosis not present

## 2019-01-27 DIAGNOSIS — I129 Hypertensive chronic kidney disease with stage 1 through stage 4 chronic kidney disease, or unspecified chronic kidney disease: Secondary | ICD-10-CM | POA: Diagnosis not present

## 2019-01-27 DIAGNOSIS — M353 Polymyalgia rheumatica: Secondary | ICD-10-CM | POA: Diagnosis not present

## 2019-01-27 DIAGNOSIS — E1140 Type 2 diabetes mellitus with diabetic neuropathy, unspecified: Secondary | ICD-10-CM | POA: Diagnosis not present

## 2019-01-27 DIAGNOSIS — M1990 Unspecified osteoarthritis, unspecified site: Secondary | ICD-10-CM | POA: Diagnosis not present

## 2019-01-29 DIAGNOSIS — D631 Anemia in chronic kidney disease: Secondary | ICD-10-CM | POA: Diagnosis not present

## 2019-01-29 DIAGNOSIS — M1990 Unspecified osteoarthritis, unspecified site: Secondary | ICD-10-CM | POA: Diagnosis not present

## 2019-01-29 DIAGNOSIS — E1122 Type 2 diabetes mellitus with diabetic chronic kidney disease: Secondary | ICD-10-CM | POA: Diagnosis not present

## 2019-01-29 DIAGNOSIS — M5432 Sciatica, left side: Secondary | ICD-10-CM | POA: Diagnosis not present

## 2019-01-29 DIAGNOSIS — I129 Hypertensive chronic kidney disease with stage 1 through stage 4 chronic kidney disease, or unspecified chronic kidney disease: Secondary | ICD-10-CM | POA: Diagnosis not present

## 2019-01-29 DIAGNOSIS — E1140 Type 2 diabetes mellitus with diabetic neuropathy, unspecified: Secondary | ICD-10-CM | POA: Diagnosis not present

## 2019-01-29 DIAGNOSIS — I471 Supraventricular tachycardia: Secondary | ICD-10-CM | POA: Diagnosis not present

## 2019-01-29 DIAGNOSIS — N189 Chronic kidney disease, unspecified: Secondary | ICD-10-CM | POA: Diagnosis not present

## 2019-01-29 DIAGNOSIS — M353 Polymyalgia rheumatica: Secondary | ICD-10-CM | POA: Diagnosis not present

## 2019-02-02 DIAGNOSIS — I129 Hypertensive chronic kidney disease with stage 1 through stage 4 chronic kidney disease, or unspecified chronic kidney disease: Secondary | ICD-10-CM | POA: Diagnosis not present

## 2019-02-02 DIAGNOSIS — E1140 Type 2 diabetes mellitus with diabetic neuropathy, unspecified: Secondary | ICD-10-CM | POA: Diagnosis not present

## 2019-02-02 DIAGNOSIS — E1122 Type 2 diabetes mellitus with diabetic chronic kidney disease: Secondary | ICD-10-CM | POA: Diagnosis not present

## 2019-02-02 DIAGNOSIS — N189 Chronic kidney disease, unspecified: Secondary | ICD-10-CM | POA: Diagnosis not present

## 2019-02-02 DIAGNOSIS — M5432 Sciatica, left side: Secondary | ICD-10-CM | POA: Diagnosis not present

## 2019-02-02 DIAGNOSIS — D631 Anemia in chronic kidney disease: Secondary | ICD-10-CM | POA: Diagnosis not present

## 2019-02-02 DIAGNOSIS — I471 Supraventricular tachycardia: Secondary | ICD-10-CM | POA: Diagnosis not present

## 2019-02-02 DIAGNOSIS — M1990 Unspecified osteoarthritis, unspecified site: Secondary | ICD-10-CM | POA: Diagnosis not present

## 2019-02-02 DIAGNOSIS — M353 Polymyalgia rheumatica: Secondary | ICD-10-CM | POA: Diagnosis not present

## 2019-02-04 DIAGNOSIS — Z20828 Contact with and (suspected) exposure to other viral communicable diseases: Secondary | ICD-10-CM | POA: Diagnosis not present

## 2019-02-10 DIAGNOSIS — S3993XA Unspecified injury of pelvis, initial encounter: Secondary | ICD-10-CM | POA: Diagnosis not present

## 2019-02-10 DIAGNOSIS — I471 Supraventricular tachycardia: Secondary | ICD-10-CM | POA: Diagnosis not present

## 2019-02-10 DIAGNOSIS — E1122 Type 2 diabetes mellitus with diabetic chronic kidney disease: Secondary | ICD-10-CM | POA: Diagnosis not present

## 2019-02-10 DIAGNOSIS — U071 COVID-19: Secondary | ICD-10-CM | POA: Diagnosis not present

## 2019-02-10 DIAGNOSIS — M353 Polymyalgia rheumatica: Secondary | ICD-10-CM | POA: Diagnosis not present

## 2019-02-10 DIAGNOSIS — I129 Hypertensive chronic kidney disease with stage 1 through stage 4 chronic kidney disease, or unspecified chronic kidney disease: Secondary | ICD-10-CM | POA: Diagnosis not present

## 2019-02-10 DIAGNOSIS — N189 Chronic kidney disease, unspecified: Secondary | ICD-10-CM | POA: Diagnosis not present

## 2019-02-10 DIAGNOSIS — D631 Anemia in chronic kidney disease: Secondary | ICD-10-CM | POA: Diagnosis not present

## 2019-02-10 DIAGNOSIS — M5432 Sciatica, left side: Secondary | ICD-10-CM | POA: Diagnosis not present

## 2019-02-10 DIAGNOSIS — R58 Hemorrhage, not elsewhere classified: Secondary | ICD-10-CM | POA: Diagnosis not present

## 2019-02-10 DIAGNOSIS — R52 Pain, unspecified: Secondary | ICD-10-CM | POA: Diagnosis not present

## 2019-02-10 DIAGNOSIS — M1990 Unspecified osteoarthritis, unspecified site: Secondary | ICD-10-CM | POA: Diagnosis not present

## 2019-02-10 DIAGNOSIS — S199XXA Unspecified injury of neck, initial encounter: Secondary | ICD-10-CM | POA: Diagnosis not present

## 2019-02-10 DIAGNOSIS — S0990XA Unspecified injury of head, initial encounter: Secondary | ICD-10-CM | POA: Diagnosis not present

## 2019-02-10 DIAGNOSIS — M25519 Pain in unspecified shoulder: Secondary | ICD-10-CM | POA: Diagnosis not present

## 2019-02-10 DIAGNOSIS — E1140 Type 2 diabetes mellitus with diabetic neuropathy, unspecified: Secondary | ICD-10-CM | POA: Diagnosis not present

## 2019-02-10 DIAGNOSIS — S299XXA Unspecified injury of thorax, initial encounter: Secondary | ICD-10-CM | POA: Diagnosis not present

## 2019-02-10 DIAGNOSIS — R079 Chest pain, unspecified: Secondary | ICD-10-CM | POA: Diagnosis not present

## 2019-02-10 DIAGNOSIS — R410 Disorientation, unspecified: Secondary | ICD-10-CM | POA: Diagnosis not present

## 2019-02-11 DIAGNOSIS — E1140 Type 2 diabetes mellitus with diabetic neuropathy, unspecified: Secondary | ICD-10-CM | POA: Diagnosis not present

## 2019-02-11 DIAGNOSIS — N189 Chronic kidney disease, unspecified: Secondary | ICD-10-CM | POA: Diagnosis not present

## 2019-02-11 DIAGNOSIS — M5432 Sciatica, left side: Secondary | ICD-10-CM | POA: Diagnosis not present

## 2019-02-11 DIAGNOSIS — M353 Polymyalgia rheumatica: Secondary | ICD-10-CM | POA: Diagnosis not present

## 2019-02-11 DIAGNOSIS — I129 Hypertensive chronic kidney disease with stage 1 through stage 4 chronic kidney disease, or unspecified chronic kidney disease: Secondary | ICD-10-CM | POA: Diagnosis not present

## 2019-02-11 DIAGNOSIS — M1990 Unspecified osteoarthritis, unspecified site: Secondary | ICD-10-CM | POA: Diagnosis not present

## 2019-02-11 DIAGNOSIS — I471 Supraventricular tachycardia: Secondary | ICD-10-CM | POA: Diagnosis not present

## 2019-02-11 DIAGNOSIS — E1122 Type 2 diabetes mellitus with diabetic chronic kidney disease: Secondary | ICD-10-CM | POA: Diagnosis not present

## 2019-02-11 DIAGNOSIS — D631 Anemia in chronic kidney disease: Secondary | ICD-10-CM | POA: Diagnosis not present

## 2019-02-23 DIAGNOSIS — E1140 Type 2 diabetes mellitus with diabetic neuropathy, unspecified: Secondary | ICD-10-CM | POA: Diagnosis not present

## 2019-02-23 DIAGNOSIS — M353 Polymyalgia rheumatica: Secondary | ICD-10-CM | POA: Diagnosis not present

## 2019-02-23 DIAGNOSIS — I471 Supraventricular tachycardia: Secondary | ICD-10-CM | POA: Diagnosis not present

## 2019-02-23 DIAGNOSIS — M1990 Unspecified osteoarthritis, unspecified site: Secondary | ICD-10-CM | POA: Diagnosis not present

## 2019-02-23 DIAGNOSIS — D631 Anemia in chronic kidney disease: Secondary | ICD-10-CM | POA: Diagnosis not present

## 2019-02-23 DIAGNOSIS — I129 Hypertensive chronic kidney disease with stage 1 through stage 4 chronic kidney disease, or unspecified chronic kidney disease: Secondary | ICD-10-CM | POA: Diagnosis not present

## 2019-02-23 DIAGNOSIS — M1712 Unilateral primary osteoarthritis, left knee: Secondary | ICD-10-CM | POA: Diagnosis not present

## 2019-02-23 DIAGNOSIS — M5432 Sciatica, left side: Secondary | ICD-10-CM | POA: Diagnosis not present

## 2019-02-23 DIAGNOSIS — N189 Chronic kidney disease, unspecified: Secondary | ICD-10-CM | POA: Diagnosis not present

## 2019-02-23 DIAGNOSIS — E1122 Type 2 diabetes mellitus with diabetic chronic kidney disease: Secondary | ICD-10-CM | POA: Diagnosis not present

## 2019-02-25 DIAGNOSIS — N189 Chronic kidney disease, unspecified: Secondary | ICD-10-CM | POA: Diagnosis not present

## 2019-02-25 DIAGNOSIS — M5432 Sciatica, left side: Secondary | ICD-10-CM | POA: Diagnosis not present

## 2019-02-25 DIAGNOSIS — E1140 Type 2 diabetes mellitus with diabetic neuropathy, unspecified: Secondary | ICD-10-CM | POA: Diagnosis not present

## 2019-02-25 DIAGNOSIS — E1122 Type 2 diabetes mellitus with diabetic chronic kidney disease: Secondary | ICD-10-CM | POA: Diagnosis not present

## 2019-02-25 DIAGNOSIS — M353 Polymyalgia rheumatica: Secondary | ICD-10-CM | POA: Diagnosis not present

## 2019-02-25 DIAGNOSIS — D631 Anemia in chronic kidney disease: Secondary | ICD-10-CM | POA: Diagnosis not present

## 2019-02-25 DIAGNOSIS — I471 Supraventricular tachycardia: Secondary | ICD-10-CM | POA: Diagnosis not present

## 2019-02-25 DIAGNOSIS — I129 Hypertensive chronic kidney disease with stage 1 through stage 4 chronic kidney disease, or unspecified chronic kidney disease: Secondary | ICD-10-CM | POA: Diagnosis not present

## 2019-02-25 DIAGNOSIS — M1990 Unspecified osteoarthritis, unspecified site: Secondary | ICD-10-CM | POA: Diagnosis not present

## 2019-02-27 DIAGNOSIS — D649 Anemia, unspecified: Secondary | ICD-10-CM | POA: Diagnosis not present

## 2019-02-27 DIAGNOSIS — R531 Weakness: Secondary | ICD-10-CM | POA: Diagnosis not present

## 2019-02-27 DIAGNOSIS — K219 Gastro-esophageal reflux disease without esophagitis: Secondary | ICD-10-CM | POA: Diagnosis not present

## 2019-02-27 DIAGNOSIS — E079 Disorder of thyroid, unspecified: Secondary | ICD-10-CM | POA: Diagnosis not present

## 2019-02-27 DIAGNOSIS — R600 Localized edema: Secondary | ICD-10-CM | POA: Diagnosis not present

## 2019-02-27 DIAGNOSIS — E785 Hyperlipidemia, unspecified: Secondary | ICD-10-CM | POA: Diagnosis not present

## 2019-02-27 DIAGNOSIS — E1122 Type 2 diabetes mellitus with diabetic chronic kidney disease: Secondary | ICD-10-CM | POA: Diagnosis not present

## 2019-02-27 DIAGNOSIS — Z6828 Body mass index (BMI) 28.0-28.9, adult: Secondary | ICD-10-CM | POA: Diagnosis not present

## 2019-02-27 DIAGNOSIS — E1140 Type 2 diabetes mellitus with diabetic neuropathy, unspecified: Secondary | ICD-10-CM | POA: Diagnosis not present

## 2019-02-27 DIAGNOSIS — I10 Essential (primary) hypertension: Secondary | ICD-10-CM | POA: Diagnosis not present

## 2019-03-05 DIAGNOSIS — N189 Chronic kidney disease, unspecified: Secondary | ICD-10-CM | POA: Diagnosis not present

## 2019-03-05 DIAGNOSIS — M5432 Sciatica, left side: Secondary | ICD-10-CM | POA: Diagnosis not present

## 2019-03-05 DIAGNOSIS — M353 Polymyalgia rheumatica: Secondary | ICD-10-CM | POA: Diagnosis not present

## 2019-03-05 DIAGNOSIS — E1140 Type 2 diabetes mellitus with diabetic neuropathy, unspecified: Secondary | ICD-10-CM | POA: Diagnosis not present

## 2019-03-05 DIAGNOSIS — I129 Hypertensive chronic kidney disease with stage 1 through stage 4 chronic kidney disease, or unspecified chronic kidney disease: Secondary | ICD-10-CM | POA: Diagnosis not present

## 2019-03-05 DIAGNOSIS — D631 Anemia in chronic kidney disease: Secondary | ICD-10-CM | POA: Diagnosis not present

## 2019-03-05 DIAGNOSIS — I471 Supraventricular tachycardia: Secondary | ICD-10-CM | POA: Diagnosis not present

## 2019-03-05 DIAGNOSIS — M1990 Unspecified osteoarthritis, unspecified site: Secondary | ICD-10-CM | POA: Diagnosis not present

## 2019-03-05 DIAGNOSIS — E1122 Type 2 diabetes mellitus with diabetic chronic kidney disease: Secondary | ICD-10-CM | POA: Diagnosis not present

## 2019-03-11 DIAGNOSIS — M5432 Sciatica, left side: Secondary | ICD-10-CM | POA: Diagnosis not present

## 2019-03-11 DIAGNOSIS — E1140 Type 2 diabetes mellitus with diabetic neuropathy, unspecified: Secondary | ICD-10-CM | POA: Diagnosis not present

## 2019-03-11 DIAGNOSIS — I129 Hypertensive chronic kidney disease with stage 1 through stage 4 chronic kidney disease, or unspecified chronic kidney disease: Secondary | ICD-10-CM | POA: Diagnosis not present

## 2019-03-11 DIAGNOSIS — M1990 Unspecified osteoarthritis, unspecified site: Secondary | ICD-10-CM | POA: Diagnosis not present

## 2019-03-11 DIAGNOSIS — D631 Anemia in chronic kidney disease: Secondary | ICD-10-CM | POA: Diagnosis not present

## 2019-03-11 DIAGNOSIS — M353 Polymyalgia rheumatica: Secondary | ICD-10-CM | POA: Diagnosis not present

## 2019-03-11 DIAGNOSIS — I471 Supraventricular tachycardia: Secondary | ICD-10-CM | POA: Diagnosis not present

## 2019-03-11 DIAGNOSIS — N189 Chronic kidney disease, unspecified: Secondary | ICD-10-CM | POA: Diagnosis not present

## 2019-03-11 DIAGNOSIS — E1122 Type 2 diabetes mellitus with diabetic chronic kidney disease: Secondary | ICD-10-CM | POA: Diagnosis not present

## 2019-03-16 DIAGNOSIS — N189 Chronic kidney disease, unspecified: Secondary | ICD-10-CM | POA: Diagnosis not present

## 2019-03-16 DIAGNOSIS — D631 Anemia in chronic kidney disease: Secondary | ICD-10-CM | POA: Diagnosis not present

## 2019-03-16 DIAGNOSIS — M5432 Sciatica, left side: Secondary | ICD-10-CM | POA: Diagnosis not present

## 2019-03-16 DIAGNOSIS — I471 Supraventricular tachycardia: Secondary | ICD-10-CM | POA: Diagnosis not present

## 2019-03-16 DIAGNOSIS — M1990 Unspecified osteoarthritis, unspecified site: Secondary | ICD-10-CM | POA: Diagnosis not present

## 2019-03-16 DIAGNOSIS — E1122 Type 2 diabetes mellitus with diabetic chronic kidney disease: Secondary | ICD-10-CM | POA: Diagnosis not present

## 2019-03-16 DIAGNOSIS — E1140 Type 2 diabetes mellitus with diabetic neuropathy, unspecified: Secondary | ICD-10-CM | POA: Diagnosis not present

## 2019-03-16 DIAGNOSIS — I129 Hypertensive chronic kidney disease with stage 1 through stage 4 chronic kidney disease, or unspecified chronic kidney disease: Secondary | ICD-10-CM | POA: Diagnosis not present

## 2019-03-16 DIAGNOSIS — M353 Polymyalgia rheumatica: Secondary | ICD-10-CM | POA: Diagnosis not present

## 2019-03-18 DIAGNOSIS — I129 Hypertensive chronic kidney disease with stage 1 through stage 4 chronic kidney disease, or unspecified chronic kidney disease: Secondary | ICD-10-CM | POA: Diagnosis not present

## 2019-03-18 DIAGNOSIS — D631 Anemia in chronic kidney disease: Secondary | ICD-10-CM | POA: Diagnosis not present

## 2019-03-18 DIAGNOSIS — M5432 Sciatica, left side: Secondary | ICD-10-CM | POA: Diagnosis not present

## 2019-03-18 DIAGNOSIS — M1990 Unspecified osteoarthritis, unspecified site: Secondary | ICD-10-CM | POA: Diagnosis not present

## 2019-03-18 DIAGNOSIS — E1140 Type 2 diabetes mellitus with diabetic neuropathy, unspecified: Secondary | ICD-10-CM | POA: Diagnosis not present

## 2019-03-18 DIAGNOSIS — E1122 Type 2 diabetes mellitus with diabetic chronic kidney disease: Secondary | ICD-10-CM | POA: Diagnosis not present

## 2019-03-18 DIAGNOSIS — M353 Polymyalgia rheumatica: Secondary | ICD-10-CM | POA: Diagnosis not present

## 2019-03-18 DIAGNOSIS — I471 Supraventricular tachycardia: Secondary | ICD-10-CM | POA: Diagnosis not present

## 2019-03-18 DIAGNOSIS — N189 Chronic kidney disease, unspecified: Secondary | ICD-10-CM | POA: Diagnosis not present

## 2019-03-23 DIAGNOSIS — D631 Anemia in chronic kidney disease: Secondary | ICD-10-CM | POA: Diagnosis not present

## 2019-03-23 DIAGNOSIS — M5432 Sciatica, left side: Secondary | ICD-10-CM | POA: Diagnosis not present

## 2019-03-23 DIAGNOSIS — M353 Polymyalgia rheumatica: Secondary | ICD-10-CM | POA: Diagnosis not present

## 2019-03-23 DIAGNOSIS — N189 Chronic kidney disease, unspecified: Secondary | ICD-10-CM | POA: Diagnosis not present

## 2019-03-23 DIAGNOSIS — I471 Supraventricular tachycardia: Secondary | ICD-10-CM | POA: Diagnosis not present

## 2019-03-23 DIAGNOSIS — M1990 Unspecified osteoarthritis, unspecified site: Secondary | ICD-10-CM | POA: Diagnosis not present

## 2019-03-23 DIAGNOSIS — I129 Hypertensive chronic kidney disease with stage 1 through stage 4 chronic kidney disease, or unspecified chronic kidney disease: Secondary | ICD-10-CM | POA: Diagnosis not present

## 2019-03-23 DIAGNOSIS — E1122 Type 2 diabetes mellitus with diabetic chronic kidney disease: Secondary | ICD-10-CM | POA: Diagnosis not present

## 2019-03-23 DIAGNOSIS — E1140 Type 2 diabetes mellitus with diabetic neuropathy, unspecified: Secondary | ICD-10-CM | POA: Diagnosis not present

## 2019-03-25 DIAGNOSIS — I471 Supraventricular tachycardia: Secondary | ICD-10-CM | POA: Diagnosis not present

## 2019-03-25 DIAGNOSIS — M1990 Unspecified osteoarthritis, unspecified site: Secondary | ICD-10-CM | POA: Diagnosis not present

## 2019-03-25 DIAGNOSIS — M5432 Sciatica, left side: Secondary | ICD-10-CM | POA: Diagnosis not present

## 2019-03-25 DIAGNOSIS — E1122 Type 2 diabetes mellitus with diabetic chronic kidney disease: Secondary | ICD-10-CM | POA: Diagnosis not present

## 2019-03-25 DIAGNOSIS — I129 Hypertensive chronic kidney disease with stage 1 through stage 4 chronic kidney disease, or unspecified chronic kidney disease: Secondary | ICD-10-CM | POA: Diagnosis not present

## 2019-03-25 DIAGNOSIS — E1140 Type 2 diabetes mellitus with diabetic neuropathy, unspecified: Secondary | ICD-10-CM | POA: Diagnosis not present

## 2019-03-25 DIAGNOSIS — N189 Chronic kidney disease, unspecified: Secondary | ICD-10-CM | POA: Diagnosis not present

## 2019-03-25 DIAGNOSIS — D631 Anemia in chronic kidney disease: Secondary | ICD-10-CM | POA: Diagnosis not present

## 2019-03-25 DIAGNOSIS — M353 Polymyalgia rheumatica: Secondary | ICD-10-CM | POA: Diagnosis not present

## 2019-03-31 DIAGNOSIS — N189 Chronic kidney disease, unspecified: Secondary | ICD-10-CM | POA: Diagnosis not present

## 2019-03-31 DIAGNOSIS — M1990 Unspecified osteoarthritis, unspecified site: Secondary | ICD-10-CM | POA: Diagnosis not present

## 2019-03-31 DIAGNOSIS — E1122 Type 2 diabetes mellitus with diabetic chronic kidney disease: Secondary | ICD-10-CM | POA: Diagnosis not present

## 2019-03-31 DIAGNOSIS — I129 Hypertensive chronic kidney disease with stage 1 through stage 4 chronic kidney disease, or unspecified chronic kidney disease: Secondary | ICD-10-CM | POA: Diagnosis not present

## 2019-03-31 DIAGNOSIS — E1140 Type 2 diabetes mellitus with diabetic neuropathy, unspecified: Secondary | ICD-10-CM | POA: Diagnosis not present

## 2019-03-31 DIAGNOSIS — I471 Supraventricular tachycardia: Secondary | ICD-10-CM | POA: Diagnosis not present

## 2019-03-31 DIAGNOSIS — D631 Anemia in chronic kidney disease: Secondary | ICD-10-CM | POA: Diagnosis not present

## 2019-03-31 DIAGNOSIS — M353 Polymyalgia rheumatica: Secondary | ICD-10-CM | POA: Diagnosis not present

## 2019-03-31 DIAGNOSIS — M5432 Sciatica, left side: Secondary | ICD-10-CM | POA: Diagnosis not present

## 2019-04-08 DIAGNOSIS — N189 Chronic kidney disease, unspecified: Secondary | ICD-10-CM | POA: Diagnosis not present

## 2019-04-08 DIAGNOSIS — I129 Hypertensive chronic kidney disease with stage 1 through stage 4 chronic kidney disease, or unspecified chronic kidney disease: Secondary | ICD-10-CM | POA: Diagnosis not present

## 2019-04-08 DIAGNOSIS — M353 Polymyalgia rheumatica: Secondary | ICD-10-CM | POA: Diagnosis not present

## 2019-04-08 DIAGNOSIS — E1122 Type 2 diabetes mellitus with diabetic chronic kidney disease: Secondary | ICD-10-CM | POA: Diagnosis not present

## 2019-04-08 DIAGNOSIS — E1140 Type 2 diabetes mellitus with diabetic neuropathy, unspecified: Secondary | ICD-10-CM | POA: Diagnosis not present

## 2019-04-08 DIAGNOSIS — D631 Anemia in chronic kidney disease: Secondary | ICD-10-CM | POA: Diagnosis not present

## 2019-04-08 DIAGNOSIS — M5432 Sciatica, left side: Secondary | ICD-10-CM | POA: Diagnosis not present

## 2019-04-08 DIAGNOSIS — I471 Supraventricular tachycardia: Secondary | ICD-10-CM | POA: Diagnosis not present

## 2019-04-08 DIAGNOSIS — M1990 Unspecified osteoarthritis, unspecified site: Secondary | ICD-10-CM | POA: Diagnosis not present

## 2019-04-17 DIAGNOSIS — D631 Anemia in chronic kidney disease: Secondary | ICD-10-CM | POA: Diagnosis not present

## 2019-04-17 DIAGNOSIS — M1990 Unspecified osteoarthritis, unspecified site: Secondary | ICD-10-CM | POA: Diagnosis not present

## 2019-04-17 DIAGNOSIS — I129 Hypertensive chronic kidney disease with stage 1 through stage 4 chronic kidney disease, or unspecified chronic kidney disease: Secondary | ICD-10-CM | POA: Diagnosis not present

## 2019-04-17 DIAGNOSIS — N189 Chronic kidney disease, unspecified: Secondary | ICD-10-CM | POA: Diagnosis not present

## 2019-04-17 DIAGNOSIS — E1140 Type 2 diabetes mellitus with diabetic neuropathy, unspecified: Secondary | ICD-10-CM | POA: Diagnosis not present

## 2019-04-17 DIAGNOSIS — I471 Supraventricular tachycardia: Secondary | ICD-10-CM | POA: Diagnosis not present

## 2019-04-17 DIAGNOSIS — E1122 Type 2 diabetes mellitus with diabetic chronic kidney disease: Secondary | ICD-10-CM | POA: Diagnosis not present

## 2019-04-17 DIAGNOSIS — M353 Polymyalgia rheumatica: Secondary | ICD-10-CM | POA: Diagnosis not present

## 2019-04-17 DIAGNOSIS — M5432 Sciatica, left side: Secondary | ICD-10-CM | POA: Diagnosis not present

## 2019-06-01 DIAGNOSIS — M1712 Unilateral primary osteoarthritis, left knee: Secondary | ICD-10-CM | POA: Diagnosis not present

## 2019-06-04 DIAGNOSIS — I10 Essential (primary) hypertension: Secondary | ICD-10-CM | POA: Diagnosis not present

## 2019-06-04 DIAGNOSIS — R531 Weakness: Secondary | ICD-10-CM | POA: Diagnosis not present

## 2019-06-04 DIAGNOSIS — R600 Localized edema: Secondary | ICD-10-CM | POA: Diagnosis not present

## 2019-06-04 DIAGNOSIS — E1122 Type 2 diabetes mellitus with diabetic chronic kidney disease: Secondary | ICD-10-CM | POA: Diagnosis not present

## 2019-06-04 DIAGNOSIS — K219 Gastro-esophageal reflux disease without esophagitis: Secondary | ICD-10-CM | POA: Diagnosis not present

## 2019-06-04 DIAGNOSIS — E1140 Type 2 diabetes mellitus with diabetic neuropathy, unspecified: Secondary | ICD-10-CM | POA: Diagnosis not present

## 2019-06-04 DIAGNOSIS — E785 Hyperlipidemia, unspecified: Secondary | ICD-10-CM | POA: Diagnosis not present

## 2019-06-04 DIAGNOSIS — E079 Disorder of thyroid, unspecified: Secondary | ICD-10-CM | POA: Diagnosis not present

## 2019-06-04 DIAGNOSIS — Z6830 Body mass index (BMI) 30.0-30.9, adult: Secondary | ICD-10-CM | POA: Diagnosis not present

## 2019-06-23 DIAGNOSIS — Z1231 Encounter for screening mammogram for malignant neoplasm of breast: Secondary | ICD-10-CM | POA: Diagnosis not present

## 2019-06-23 DIAGNOSIS — Z9181 History of falling: Secondary | ICD-10-CM | POA: Diagnosis not present

## 2019-06-23 DIAGNOSIS — Z0000 Encounter for general adult medical examination without abnormal findings: Secondary | ICD-10-CM | POA: Diagnosis not present

## 2019-06-23 DIAGNOSIS — E785 Hyperlipidemia, unspecified: Secondary | ICD-10-CM | POA: Diagnosis not present

## 2019-06-23 DIAGNOSIS — Z1331 Encounter for screening for depression: Secondary | ICD-10-CM | POA: Diagnosis not present

## 2019-07-02 ENCOUNTER — Other Ambulatory Visit: Payer: Self-pay | Admitting: Internal Medicine

## 2019-07-02 DIAGNOSIS — Z1231 Encounter for screening mammogram for malignant neoplasm of breast: Secondary | ICD-10-CM

## 2019-08-07 DIAGNOSIS — R600 Localized edema: Secondary | ICD-10-CM | POA: Diagnosis not present

## 2019-08-07 DIAGNOSIS — I10 Essential (primary) hypertension: Secondary | ICD-10-CM | POA: Diagnosis not present

## 2019-08-07 DIAGNOSIS — Z6830 Body mass index (BMI) 30.0-30.9, adult: Secondary | ICD-10-CM | POA: Diagnosis not present

## 2019-09-10 DIAGNOSIS — L821 Other seborrheic keratosis: Secondary | ICD-10-CM | POA: Diagnosis not present

## 2019-09-10 DIAGNOSIS — L570 Actinic keratosis: Secondary | ICD-10-CM | POA: Diagnosis not present

## 2019-09-10 DIAGNOSIS — L578 Other skin changes due to chronic exposure to nonionizing radiation: Secondary | ICD-10-CM | POA: Diagnosis not present

## 2019-09-18 DIAGNOSIS — S60922A Unspecified superficial injury of left hand, initial encounter: Secondary | ICD-10-CM | POA: Diagnosis not present

## 2019-09-18 DIAGNOSIS — I10 Essential (primary) hypertension: Secondary | ICD-10-CM | POA: Diagnosis not present

## 2019-09-18 DIAGNOSIS — Z6830 Body mass index (BMI) 30.0-30.9, adult: Secondary | ICD-10-CM | POA: Diagnosis not present

## 2019-09-18 DIAGNOSIS — N189 Chronic kidney disease, unspecified: Secondary | ICD-10-CM | POA: Diagnosis not present

## 2019-09-18 DIAGNOSIS — M1990 Unspecified osteoarthritis, unspecified site: Secondary | ICD-10-CM | POA: Diagnosis not present

## 2019-09-18 DIAGNOSIS — E1122 Type 2 diabetes mellitus with diabetic chronic kidney disease: Secondary | ICD-10-CM | POA: Diagnosis not present

## 2019-09-18 DIAGNOSIS — R600 Localized edema: Secondary | ICD-10-CM | POA: Diagnosis not present

## 2019-09-18 DIAGNOSIS — E1140 Type 2 diabetes mellitus with diabetic neuropathy, unspecified: Secondary | ICD-10-CM | POA: Diagnosis not present

## 2019-09-30 DIAGNOSIS — M1712 Unilateral primary osteoarthritis, left knee: Secondary | ICD-10-CM | POA: Diagnosis not present

## 2019-10-23 ENCOUNTER — Ambulatory Visit
Admission: RE | Admit: 2019-10-23 | Discharge: 2019-10-23 | Disposition: A | Discharge status: Home / Self Care | Payer: Medicare HMO | Source: Ambulatory Visit | Attending: Internal Medicine | Admitting: Internal Medicine

## 2019-10-23 ENCOUNTER — Other Ambulatory Visit: Payer: Self-pay

## 2019-10-23 DIAGNOSIS — Z1231 Encounter for screening mammogram for malignant neoplasm of breast: Secondary | ICD-10-CM | POA: Diagnosis not present

## 2019-11-16 DIAGNOSIS — H5210 Myopia, unspecified eye: Secondary | ICD-10-CM | POA: Diagnosis not present

## 2019-12-15 DIAGNOSIS — H61002 Unspecified perichondritis of left external ear: Secondary | ICD-10-CM | POA: Diagnosis not present

## 2019-12-15 DIAGNOSIS — L570 Actinic keratosis: Secondary | ICD-10-CM | POA: Diagnosis not present

## 2019-12-24 DIAGNOSIS — E1122 Type 2 diabetes mellitus with diabetic chronic kidney disease: Secondary | ICD-10-CM | POA: Diagnosis not present

## 2019-12-24 DIAGNOSIS — Z139 Encounter for screening, unspecified: Secondary | ICD-10-CM | POA: Diagnosis not present

## 2019-12-24 DIAGNOSIS — E785 Hyperlipidemia, unspecified: Secondary | ICD-10-CM | POA: Diagnosis not present

## 2019-12-24 DIAGNOSIS — K219 Gastro-esophageal reflux disease without esophagitis: Secondary | ICD-10-CM | POA: Diagnosis not present

## 2019-12-24 DIAGNOSIS — Z6831 Body mass index (BMI) 31.0-31.9, adult: Secondary | ICD-10-CM | POA: Diagnosis not present

## 2019-12-24 DIAGNOSIS — E1140 Type 2 diabetes mellitus with diabetic neuropathy, unspecified: Secondary | ICD-10-CM | POA: Diagnosis not present

## 2019-12-24 DIAGNOSIS — R600 Localized edema: Secondary | ICD-10-CM | POA: Diagnosis not present

## 2019-12-24 DIAGNOSIS — R531 Weakness: Secondary | ICD-10-CM | POA: Diagnosis not present

## 2019-12-24 DIAGNOSIS — E079 Disorder of thyroid, unspecified: Secondary | ICD-10-CM | POA: Diagnosis not present

## 2019-12-24 DIAGNOSIS — I10 Essential (primary) hypertension: Secondary | ICD-10-CM | POA: Diagnosis not present

## 2020-01-05 DIAGNOSIS — L570 Actinic keratosis: Secondary | ICD-10-CM | POA: Diagnosis not present

## 2020-01-05 DIAGNOSIS — H61002 Unspecified perichondritis of left external ear: Secondary | ICD-10-CM | POA: Diagnosis not present

## 2020-01-05 DIAGNOSIS — L728 Other follicular cysts of the skin and subcutaneous tissue: Secondary | ICD-10-CM | POA: Diagnosis not present

## 2020-01-05 DIAGNOSIS — C44319 Basal cell carcinoma of skin of other parts of face: Secondary | ICD-10-CM | POA: Diagnosis not present

## 2020-01-06 DIAGNOSIS — M1712 Unilateral primary osteoarthritis, left knee: Secondary | ICD-10-CM | POA: Diagnosis not present

## 2020-02-03 DIAGNOSIS — C4401 Basal cell carcinoma of skin of lip: Secondary | ICD-10-CM | POA: Diagnosis not present

## 2020-03-25 DIAGNOSIS — R600 Localized edema: Secondary | ICD-10-CM | POA: Diagnosis not present

## 2020-03-25 DIAGNOSIS — E785 Hyperlipidemia, unspecified: Secondary | ICD-10-CM | POA: Diagnosis not present

## 2020-03-25 DIAGNOSIS — E1140 Type 2 diabetes mellitus with diabetic neuropathy, unspecified: Secondary | ICD-10-CM | POA: Diagnosis not present

## 2020-03-25 DIAGNOSIS — E1122 Type 2 diabetes mellitus with diabetic chronic kidney disease: Secondary | ICD-10-CM | POA: Diagnosis not present

## 2020-03-25 DIAGNOSIS — K219 Gastro-esophageal reflux disease without esophagitis: Secondary | ICD-10-CM | POA: Diagnosis not present

## 2020-03-25 DIAGNOSIS — I10 Essential (primary) hypertension: Secondary | ICD-10-CM | POA: Diagnosis not present

## 2020-03-25 DIAGNOSIS — Z6830 Body mass index (BMI) 30.0-30.9, adult: Secondary | ICD-10-CM | POA: Diagnosis not present

## 2020-03-25 DIAGNOSIS — G629 Polyneuropathy, unspecified: Secondary | ICD-10-CM | POA: Diagnosis not present

## 2020-03-25 DIAGNOSIS — E079 Disorder of thyroid, unspecified: Secondary | ICD-10-CM | POA: Diagnosis not present

## 2020-04-08 DIAGNOSIS — M1712 Unilateral primary osteoarthritis, left knee: Secondary | ICD-10-CM | POA: Diagnosis not present

## 2020-06-23 DIAGNOSIS — Z1331 Encounter for screening for depression: Secondary | ICD-10-CM | POA: Diagnosis not present

## 2020-06-23 DIAGNOSIS — E785 Hyperlipidemia, unspecified: Secondary | ICD-10-CM | POA: Diagnosis not present

## 2020-06-23 DIAGNOSIS — Z9181 History of falling: Secondary | ICD-10-CM | POA: Diagnosis not present

## 2020-06-23 DIAGNOSIS — Z0000 Encounter for general adult medical examination without abnormal findings: Secondary | ICD-10-CM | POA: Diagnosis not present

## 2020-06-24 DIAGNOSIS — M1990 Unspecified osteoarthritis, unspecified site: Secondary | ICD-10-CM | POA: Diagnosis not present

## 2020-06-24 DIAGNOSIS — E785 Hyperlipidemia, unspecified: Secondary | ICD-10-CM | POA: Diagnosis not present

## 2020-06-24 DIAGNOSIS — G629 Polyneuropathy, unspecified: Secondary | ICD-10-CM | POA: Diagnosis not present

## 2020-06-24 DIAGNOSIS — E1140 Type 2 diabetes mellitus with diabetic neuropathy, unspecified: Secondary | ICD-10-CM | POA: Diagnosis not present

## 2020-06-24 DIAGNOSIS — I10 Essential (primary) hypertension: Secondary | ICD-10-CM | POA: Diagnosis not present

## 2020-06-24 DIAGNOSIS — E079 Disorder of thyroid, unspecified: Secondary | ICD-10-CM | POA: Diagnosis not present

## 2020-06-24 DIAGNOSIS — R600 Localized edema: Secondary | ICD-10-CM | POA: Diagnosis not present

## 2020-06-24 DIAGNOSIS — E1122 Type 2 diabetes mellitus with diabetic chronic kidney disease: Secondary | ICD-10-CM | POA: Diagnosis not present

## 2020-06-24 DIAGNOSIS — K219 Gastro-esophageal reflux disease without esophagitis: Secondary | ICD-10-CM | POA: Diagnosis not present

## 2020-07-06 DIAGNOSIS — M1712 Unilateral primary osteoarthritis, left knee: Secondary | ICD-10-CM | POA: Diagnosis not present

## 2020-09-09 ENCOUNTER — Other Ambulatory Visit: Payer: Self-pay

## 2020-09-09 ENCOUNTER — Ambulatory Visit: Payer: Medicare HMO | Admitting: Sports Medicine

## 2020-09-09 ENCOUNTER — Encounter: Payer: Self-pay | Admitting: Sports Medicine

## 2020-09-09 DIAGNOSIS — B351 Tinea unguium: Secondary | ICD-10-CM

## 2020-09-09 DIAGNOSIS — Z7901 Long term (current) use of anticoagulants: Secondary | ICD-10-CM | POA: Diagnosis not present

## 2020-09-09 DIAGNOSIS — M2140 Flat foot [pes planus] (acquired), unspecified foot: Secondary | ICD-10-CM | POA: Diagnosis not present

## 2020-09-09 DIAGNOSIS — M21619 Bunion of unspecified foot: Secondary | ICD-10-CM | POA: Diagnosis not present

## 2020-09-09 DIAGNOSIS — M2041 Other hammer toe(s) (acquired), right foot: Secondary | ICD-10-CM

## 2020-09-09 DIAGNOSIS — E1140 Type 2 diabetes mellitus with diabetic neuropathy, unspecified: Secondary | ICD-10-CM | POA: Diagnosis not present

## 2020-09-09 DIAGNOSIS — L84 Corns and callosities: Secondary | ICD-10-CM

## 2020-09-09 DIAGNOSIS — M2042 Other hammer toe(s) (acquired), left foot: Secondary | ICD-10-CM

## 2020-09-09 DIAGNOSIS — M79674 Pain in right toe(s): Secondary | ICD-10-CM | POA: Diagnosis not present

## 2020-09-09 DIAGNOSIS — M79675 Pain in left toe(s): Secondary | ICD-10-CM

## 2020-09-09 NOTE — Progress Notes (Signed)
Subjective: Sherri Long is a 87 y.o. female patient with history of diabetes who presents to office today complaining of long,mildly painful nails and callus while ambulating in shoes; unable to trim. Patient states that the glucose reading this morning was not recorded but A1c 6.5.   Patient is assisted by her granddaughter this visit who helps to report history.  Patient Active Problem List   Diagnosis Date Noted   Dyslipidemia 12/29/2018   PVC's (premature ventricular contractions) 12/29/2018   CAD in native artery 07/09/2017   History of PSVT (paroxysmal supraventricular tachycardia) 07/09/2017   TIA (transient ischemic attack) 10/09/2013   Weakness 10/08/2013   Diabetes mellitus type 2, controlled ([place]) 10/08/2013   HTN (hypertension) 10/08/2013   HLD (hyperlipidemia) 10/08/2013   Gout 10/08/2013   Current Outpatient Medications on File Prior to Visit  Medication Sig Dispense Refill   famotidine (PEPCID) 20 MG tablet      gabapentin (NEURONTIN) 300 MG capsule      allopurinol (ZYLOPRIM) 300 MG tablet Take 300 mg by mouth 4 (four) times a week.      beta carotene w/minerals (OCUVITE) tablet Take 1 tablet by mouth daily.     Biotin 10 MG TABS Take by mouth.     cholecalciferol (VITAMIN D) 1000 UNITS tablet Take 1,000 Units by mouth at bedtime.      Chromium 200 MCG TABS Take 200 mcg by mouth daily.     CLOBETASOL PROPIONATE E 0.05 % emollient cream      clopidogrel (PLAVIX) 75 MG tablet Take 1 tablet (75 mg total) by mouth daily. 30 tablet 2   Fish Oil-Coenzyme Q10 (FISH OIL PLUS CO Q-10) 1000-30 MG CAPS Take 2 capsules by mouth daily.      furosemide (LASIX) 20 MG tablet Take by mouth.     hydrALAZINE (APRESOLINE) 10 MG tablet      labetalol (NORMODYNE) 100 MG tablet Take 100 mg by mouth 2 (two) times daily.     Liniments (SALONPAS PAIN RELIEF PATCH) PADS Apply 1 each topically daily as needed (for pain).     loratadine (CLARITIN) 10 MG tablet Take 10 mg by mouth at bedtime.       losartan-hydrochlorothiazide (HYZAAR) 100-25 MG tablet      metFORMIN (GLUCOPHAGE-XR) 500 MG 24 hr tablet      metFORMIN (GLUMETZA) 500 MG (MOD) 24 hr tablet Take 500 mg by mouth at bedtime.      montelukast (SINGULAIR) 10 MG tablet      OVER THE COUNTER MEDICATION Apply 1 application topically daily as needed (for pain).     pantoprazole (PROTONIX) 40 MG tablet Take 1 tablet (40 mg total) by mouth daily. 60 tablet 3   potassium chloride (K-DUR) 10 MEQ tablet Take 1 tablet (10 mEq total) by mouth 2 (two) times daily. 60 tablet 0   predniSONE (DELTASONE) 5 MG tablet Take 3 tablets (15 mg total) by mouth daily with breakfast. 60 tablet 2   rOPINIRole (REQUIP) 0.25 MG tablet      rosuvastatin (CRESTOR) 5 MG tablet Take 2.5 mg by mouth 4 (four) times a week. In PM     traMADol (ULTRAM) 50 MG tablet Take by mouth.     valsartan-hydrochlorothiazide (DIOVAN-HCT) 320-25 MG per tablet Take 1 tablet by mouth daily.     No current facility-administered medications on file prior to visit.   Allergies  Allergen Reactions   Aspirin Hives, Shortness Of Breath, Itching, Swelling, Rash and Other (See Comments)  Allergy attack-per patient    Ibuprofen Hives, Shortness Of Breath, Itching, Swelling, Rash and Other (See Comments)    Allergy attack-per patient   Nsaids Hives, Shortness Of Breath, Itching, Swelling, Rash and Other (See Comments)    MD stated to not take them    No results found for this or any previous visit (from the past 2160 hour(s)).  Objective: General: Patient is awake, alert, and oriented x 2and in no acute distress.  Integument: Skin is warm, dry and supple bilateral. Nails are tender, long, thickened and dystrophic with subungual debris, consistent with onychomycosis, 1-5 bilateral. No signs of infection.  Callus noted plantar forefoot on the right medial bunion with no signs of infection.  Remaining integument unremarkable.  Vasculature:  Dorsalis Pedis pulse 1 out of 4  bilateral. Posterior Tibial pulse 0 out of 4 bilateral. Capillary fill time <5 sec 1-5 bilateral. Positive hair growth to the level of the digits.Temperature gradient within normal limits.  Moderate varicosities present bilateral. 1+ pitting edema at ankles present bilateral.   Neurology: The patient has absent sensation measured with a 5.07/10g Semmes Weinstein Monofilament at all pedal sites bilateral . Vibratory sensation severely diminished bilateral with tuning fork.  Musculoskeletal:Asymptomatic bunion, hammertoe, pes planus, pedal deformities noted bilateral. Muscular strength 4/5 in all lower extremity muscular groups bilateral without pain on range of motion.  Assessment and Plan: Problem List Items Addressed This Visit   None Visit Diagnoses     Pain due to onychomycosis of toenails of both feet    -  Primary   Callus of foot       Bunion       Pes planus, unspecified laterality       Hammer toes of both feet       Type 2 diabetes mellitus with diabetic neuropathy, unspecified whether long term insulin use ([facility])       Relevant Medications   losartan-hydrochlorothiazide (HYZAAR) 100-25 MG tablet   metFORMIN (GLUCOPHAGE-XR) 500 MG 24 hr tablet   Current use of anticoagulant therapy           -Examined patient. -Discussed and educated patient on diabetic foot care, especially with  regards to the vascular, neurological and musculoskeletal systems.  -Stressed the importance of good glycemic control and the detriment of not  controlling glucose levels in relation to the foot. -Mechanically debrided all nails 1-5 bilateral using sterile nail nipper and filed with dremel without incident  -At no additional charge mechanically debrided keratosis/callus plantar right foot x4 using a sterile chisel blade without incident -Encouraged daily skin emollients for dry skin -Answered all patient questions -Patient to return  in 3 months for at risk foot care -Patient advised to call the  office if any problems or questions arise in the meantime.  Landis Martins, DPM

## 2020-09-15 IMAGING — CT CT HEAD W/O CM
4 series · 16 of 47 positions shown, 18 images · non-contrast
Comparison: MRI 10/09/2013

CLINICAL DATA: Fall with generalized weakness

EXAM:
CT HEAD WITHOUT CONTRAST
TECHNIQUE: Contiguous axial images were obtained from the base of the skull
through the vertex without intravenous contrast.

[Series 3: head wo · axial · 0.42mm/px · z∈[-188,-68]mm · 7 of 32 slices shown, 9 images]
[im 4/32  brain]
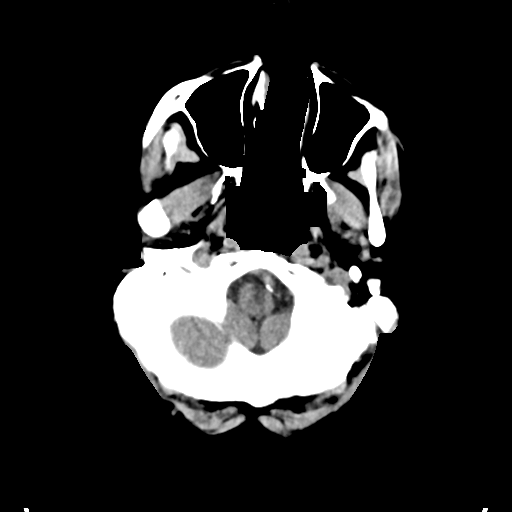
[im 4/32  bone]
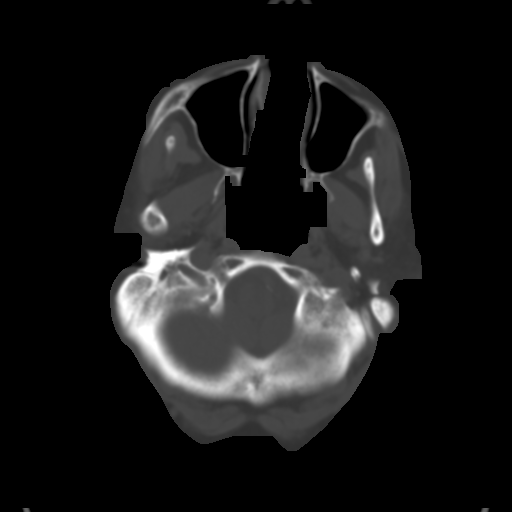
[im 8/32  brain]
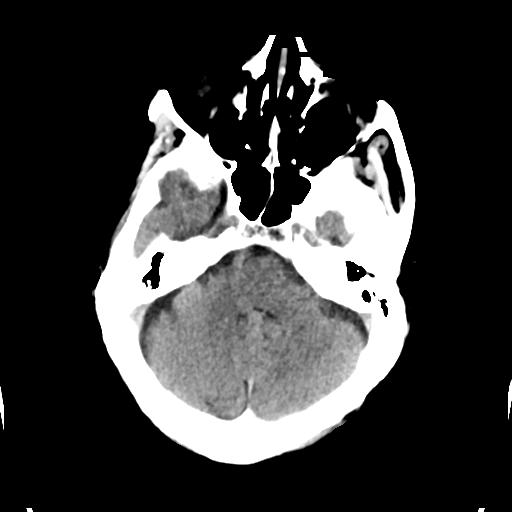
[im 12/32  brain]
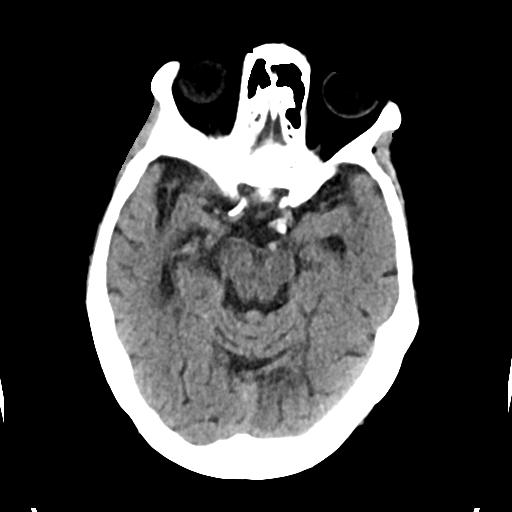
[im 16/32  brain]
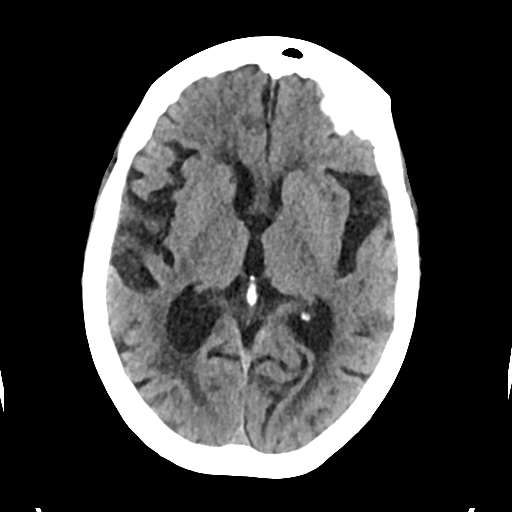
[im 20/32  brain]
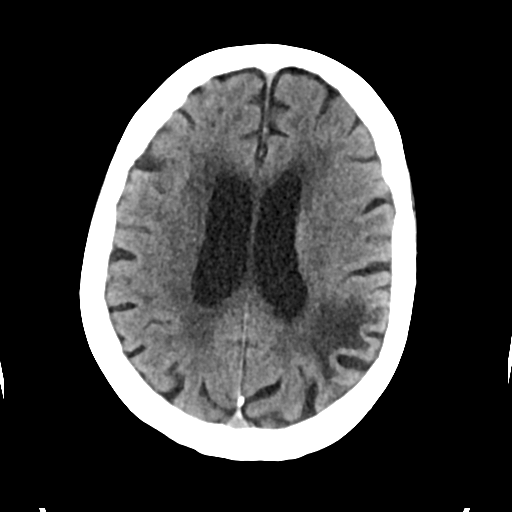
[im 20/32  bone]
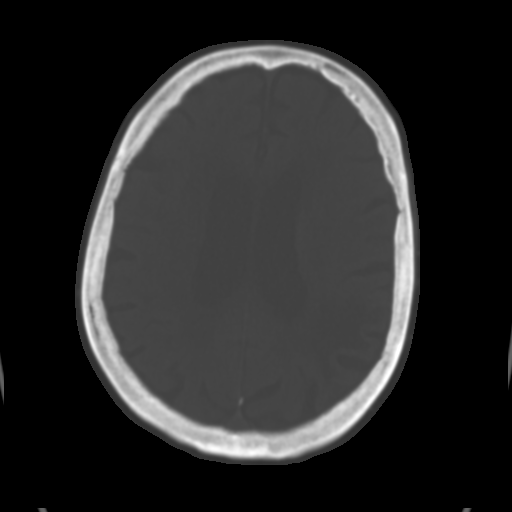
[im 24/32  brain]
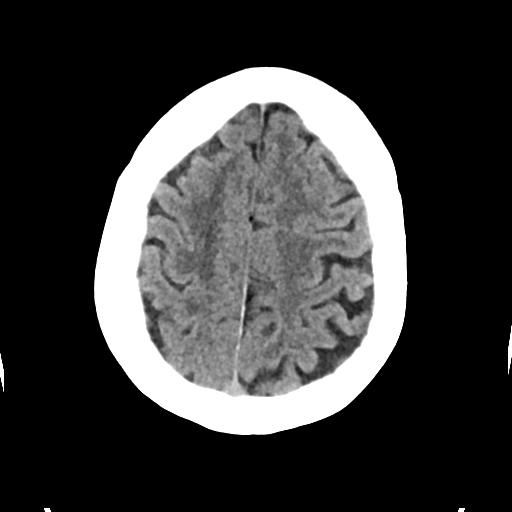
[im 28/32  brain]
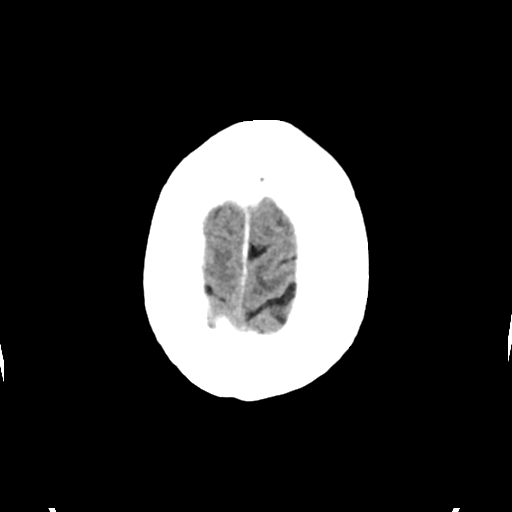

[Series 4: head bone · axial · 0.42mm/px · z∈[-190,-158]mm · 3 of 80 slices shown]
[im 8/80  bone]
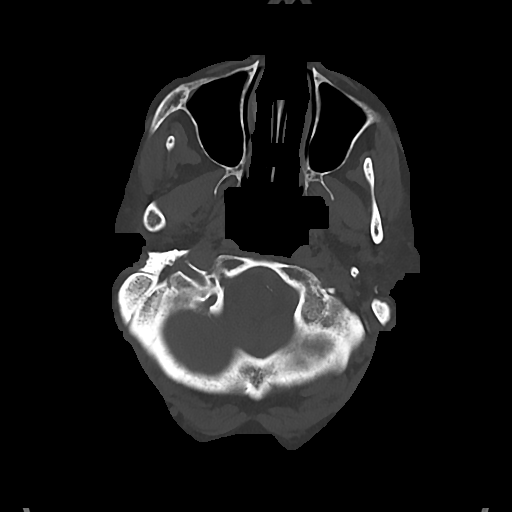
[im 16/80  bone]
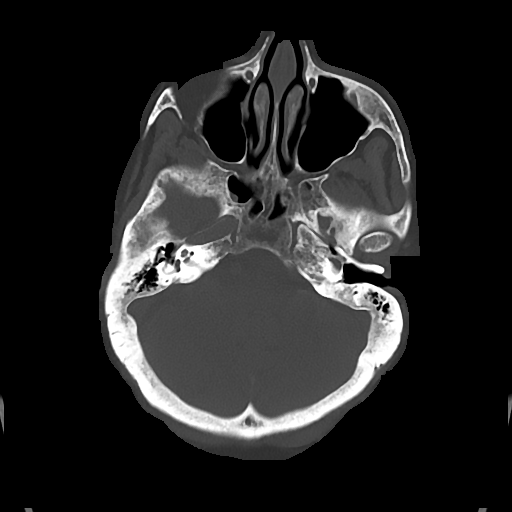
[im 24/80  bone]
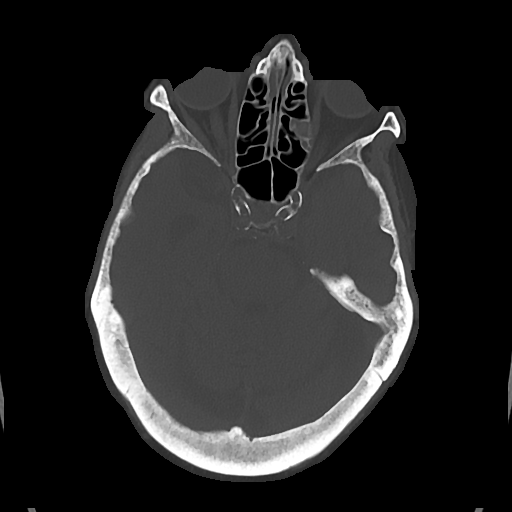

[Series 5: cor soft · coronal · 0.31mm/px · 3 of 67 slices shown]
[im 23/67  brain]
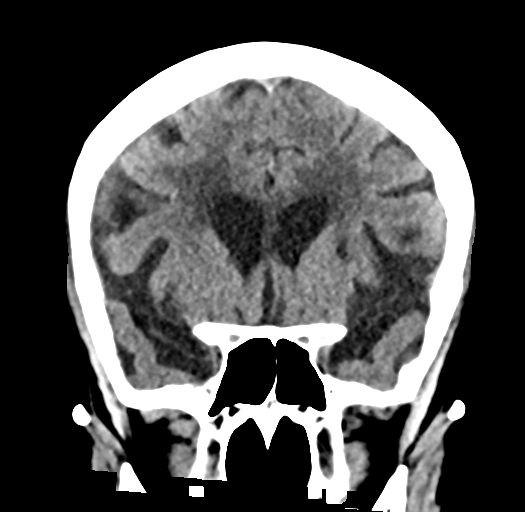
[im 30/67  brain]
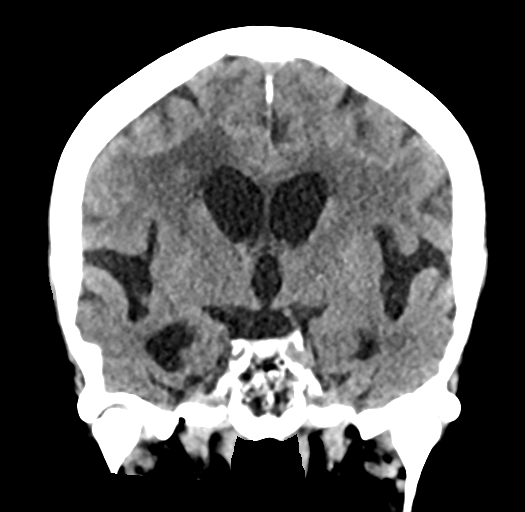
[im 37/67  brain]
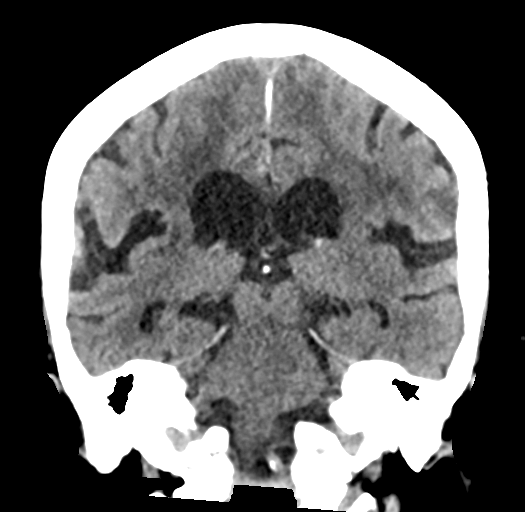

[Series 6: sag soft · sagittal · 0.30mm/px · 3 of 52 slices shown]
[im 18/52  brain]
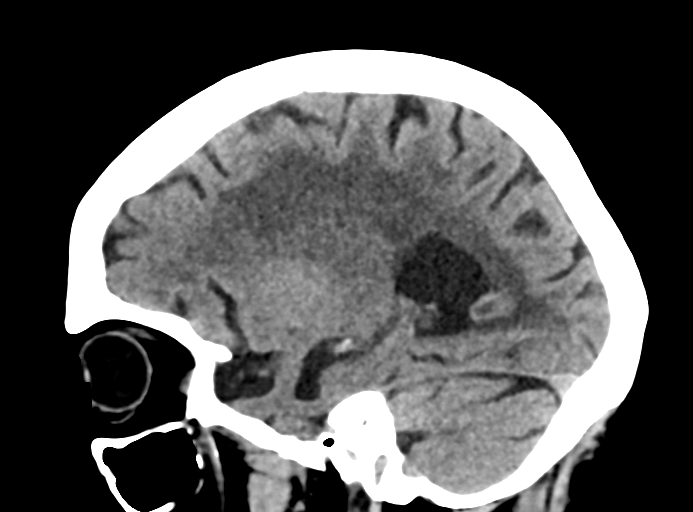
[im 26/52  brain]
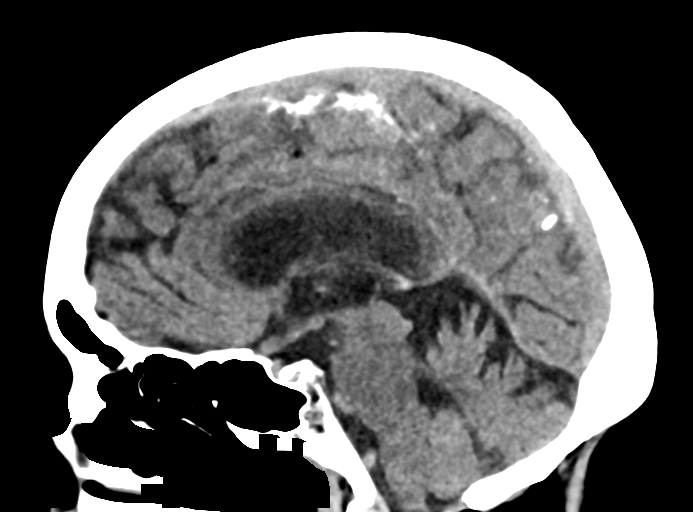
[im 35/52  brain]
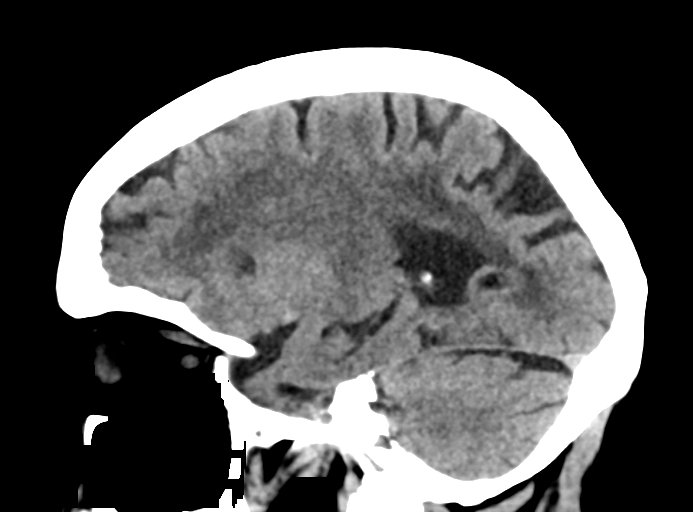

[16 of 47 positions shown; findings below may reference images not displayed]

FINDINGS: Brain: No acute territorial infarction, hemorrhage, or intracranial
mass is visualized. Moderate severe atrophy. Marked small vessel
ischemic changes of the white matter. Encephalomalacia in the left
parietal lobe. Stable ventricle size.

Vascular: No hyperdense vessels. Carotid vascular and vertebral
artery calcification

Skull: Normal. Negative for fracture or focal lesion.

Sinuses/Orbits: Mucosal thickening in the ethmoid and maxillary
sinuses. No acute orbital abnormality

Other: None
IMPRESSION: 1. No CT evidence for acute intracranial abnormality.
2. Atrophy and small vessel ischemic changes of the white matter
with chronic left parietal infarct

## 2020-09-19 DIAGNOSIS — M25519 Pain in unspecified shoulder: Secondary | ICD-10-CM | POA: Diagnosis not present

## 2020-09-19 DIAGNOSIS — R0902 Hypoxemia: Secondary | ICD-10-CM | POA: Diagnosis not present

## 2020-09-19 DIAGNOSIS — I10 Essential (primary) hypertension: Secondary | ICD-10-CM | POA: Diagnosis not present

## 2020-09-21 DIAGNOSIS — E119 Type 2 diabetes mellitus without complications: Secondary | ICD-10-CM | POA: Diagnosis not present

## 2020-09-21 DIAGNOSIS — R3989 Other symptoms and signs involving the genitourinary system: Secondary | ICD-10-CM | POA: Diagnosis not present

## 2020-09-21 DIAGNOSIS — N3001 Acute cystitis with hematuria: Secondary | ICD-10-CM | POA: Diagnosis not present

## 2020-09-22 DIAGNOSIS — R0600 Dyspnea, unspecified: Secondary | ICD-10-CM | POA: Diagnosis not present

## 2020-09-22 DIAGNOSIS — I10 Essential (primary) hypertension: Secondary | ICD-10-CM | POA: Diagnosis not present

## 2020-09-22 DIAGNOSIS — R609 Edema, unspecified: Secondary | ICD-10-CM | POA: Diagnosis not present

## 2020-09-22 DIAGNOSIS — M79604 Pain in right leg: Secondary | ICD-10-CM | POA: Diagnosis not present

## 2020-09-22 DIAGNOSIS — R1312 Dysphagia, oropharyngeal phase: Secondary | ICD-10-CM | POA: Diagnosis not present

## 2020-09-22 DIAGNOSIS — K5730 Diverticulosis of large intestine without perforation or abscess without bleeding: Secondary | ICD-10-CM | POA: Diagnosis not present

## 2020-09-22 DIAGNOSIS — Z9181 History of falling: Secondary | ICD-10-CM | POA: Diagnosis not present

## 2020-09-22 DIAGNOSIS — M545 Low back pain, unspecified: Secondary | ICD-10-CM | POA: Diagnosis not present

## 2020-09-22 DIAGNOSIS — E222 Syndrome of inappropriate secretion of antidiuretic hormone: Secondary | ICD-10-CM | POA: Diagnosis not present

## 2020-09-22 DIAGNOSIS — F0390 Unspecified dementia without behavioral disturbance: Secondary | ICD-10-CM | POA: Diagnosis not present

## 2020-09-22 DIAGNOSIS — Z743 Need for continuous supervision: Secondary | ICD-10-CM | POA: Diagnosis not present

## 2020-09-22 DIAGNOSIS — S32029A Unspecified fracture of second lumbar vertebra, initial encounter for closed fracture: Secondary | ICD-10-CM | POA: Diagnosis not present

## 2020-09-22 DIAGNOSIS — D72829 Elevated white blood cell count, unspecified: Secondary | ICD-10-CM | POA: Diagnosis not present

## 2020-09-22 DIAGNOSIS — R079 Chest pain, unspecified: Secondary | ICD-10-CM | POA: Diagnosis not present

## 2020-09-22 DIAGNOSIS — N139 Obstructive and reflux uropathy, unspecified: Secondary | ICD-10-CM | POA: Diagnosis not present

## 2020-09-22 DIAGNOSIS — R531 Weakness: Secondary | ICD-10-CM | POA: Diagnosis not present

## 2020-09-22 DIAGNOSIS — R262 Difficulty in walking, not elsewhere classified: Secondary | ICD-10-CM | POA: Diagnosis not present

## 2020-09-22 DIAGNOSIS — S32039A Unspecified fracture of third lumbar vertebra, initial encounter for closed fracture: Secondary | ICD-10-CM | POA: Diagnosis not present

## 2020-09-22 DIAGNOSIS — N390 Urinary tract infection, site not specified: Secondary | ICD-10-CM | POA: Diagnosis not present

## 2020-09-22 DIAGNOSIS — R41841 Cognitive communication deficit: Secondary | ICD-10-CM | POA: Diagnosis not present

## 2020-09-22 DIAGNOSIS — M160 Bilateral primary osteoarthritis of hip: Secondary | ICD-10-CM | POA: Diagnosis not present

## 2020-09-22 DIAGNOSIS — M79605 Pain in left leg: Secondary | ICD-10-CM | POA: Diagnosis not present

## 2020-09-22 DIAGNOSIS — I739 Peripheral vascular disease, unspecified: Secondary | ICD-10-CM | POA: Diagnosis not present

## 2020-09-22 DIAGNOSIS — S32020A Wedge compression fracture of second lumbar vertebra, initial encounter for closed fracture: Secondary | ICD-10-CM | POA: Diagnosis not present

## 2020-09-22 DIAGNOSIS — I498 Other specified cardiac arrhythmias: Secondary | ICD-10-CM | POA: Diagnosis not present

## 2020-09-22 DIAGNOSIS — M1712 Unilateral primary osteoarthritis, left knee: Secondary | ICD-10-CM | POA: Diagnosis not present

## 2020-09-22 DIAGNOSIS — G9341 Metabolic encephalopathy: Secondary | ICD-10-CM | POA: Diagnosis not present

## 2020-09-22 DIAGNOSIS — R0602 Shortness of breath: Secondary | ICD-10-CM | POA: Diagnosis not present

## 2020-09-22 DIAGNOSIS — S32030A Wedge compression fracture of third lumbar vertebra, initial encounter for closed fracture: Secondary | ICD-10-CM | POA: Diagnosis not present

## 2020-09-22 DIAGNOSIS — J189 Pneumonia, unspecified organism: Secondary | ICD-10-CM | POA: Diagnosis not present

## 2020-09-22 DIAGNOSIS — J90 Pleural effusion, not elsewhere classified: Secondary | ICD-10-CM | POA: Diagnosis not present

## 2020-09-22 DIAGNOSIS — I340 Nonrheumatic mitral (valve) insufficiency: Secondary | ICD-10-CM | POA: Diagnosis not present

## 2020-09-22 DIAGNOSIS — E119 Type 2 diabetes mellitus without complications: Secondary | ICD-10-CM | POA: Diagnosis not present

## 2020-09-22 DIAGNOSIS — M8588 Other specified disorders of bone density and structure, other site: Secondary | ICD-10-CM | POA: Diagnosis not present

## 2020-09-22 DIAGNOSIS — J9601 Acute respiratory failure with hypoxia: Secondary | ICD-10-CM | POA: Diagnosis not present

## 2020-09-22 DIAGNOSIS — R0902 Hypoxemia: Secondary | ICD-10-CM | POA: Diagnosis not present

## 2020-09-22 DIAGNOSIS — M47816 Spondylosis without myelopathy or radiculopathy, lumbar region: Secondary | ICD-10-CM | POA: Diagnosis not present

## 2020-09-22 DIAGNOSIS — N281 Cyst of kidney, acquired: Secondary | ICD-10-CM | POA: Diagnosis not present

## 2020-09-22 DIAGNOSIS — E871 Hypo-osmolality and hyponatremia: Secondary | ICD-10-CM | POA: Diagnosis not present

## 2020-09-22 DIAGNOSIS — L89159 Pressure ulcer of sacral region, unspecified stage: Secondary | ICD-10-CM | POA: Diagnosis not present

## 2020-09-22 DIAGNOSIS — I672 Cerebral atherosclerosis: Secondary | ICD-10-CM | POA: Diagnosis not present

## 2020-09-22 DIAGNOSIS — M6281 Muscle weakness (generalized): Secondary | ICD-10-CM | POA: Diagnosis not present

## 2020-09-22 DIAGNOSIS — K429 Umbilical hernia without obstruction or gangrene: Secondary | ICD-10-CM | POA: Diagnosis not present

## 2020-09-22 DIAGNOSIS — M79606 Pain in leg, unspecified: Secondary | ICD-10-CM | POA: Diagnosis not present

## 2020-09-22 DIAGNOSIS — R062 Wheezing: Secondary | ICD-10-CM | POA: Diagnosis not present

## 2020-09-22 DIAGNOSIS — M25562 Pain in left knee: Secondary | ICD-10-CM | POA: Diagnosis not present

## 2020-09-22 DIAGNOSIS — I700 Atherosclerosis of aorta: Secondary | ICD-10-CM | POA: Diagnosis not present

## 2020-09-22 DIAGNOSIS — J9811 Atelectasis: Secondary | ICD-10-CM | POA: Diagnosis not present

## 2020-09-23 DIAGNOSIS — I498 Other specified cardiac arrhythmias: Secondary | ICD-10-CM | POA: Diagnosis not present

## 2020-09-26 DIAGNOSIS — I340 Nonrheumatic mitral (valve) insufficiency: Secondary | ICD-10-CM | POA: Diagnosis not present

## 2020-09-28 DIAGNOSIS — I498 Other specified cardiac arrhythmias: Secondary | ICD-10-CM | POA: Diagnosis not present

## 2020-09-29 DIAGNOSIS — J90 Pleural effusion, not elsewhere classified: Secondary | ICD-10-CM | POA: Diagnosis not present

## 2020-09-29 DIAGNOSIS — J9811 Atelectasis: Secondary | ICD-10-CM | POA: Diagnosis not present

## 2020-09-29 DIAGNOSIS — D72829 Elevated white blood cell count, unspecified: Secondary | ICD-10-CM | POA: Diagnosis not present

## 2020-10-05 DIAGNOSIS — Z8673 Personal history of transient ischemic attack (TIA), and cerebral infarction without residual deficits: Secondary | ICD-10-CM | POA: Diagnosis not present

## 2020-10-05 DIAGNOSIS — N189 Chronic kidney disease, unspecified: Secondary | ICD-10-CM | POA: Diagnosis not present

## 2020-10-05 DIAGNOSIS — Z515 Encounter for palliative care: Secondary | ICD-10-CM | POA: Diagnosis not present

## 2020-10-05 DIAGNOSIS — M6281 Muscle weakness (generalized): Secondary | ICD-10-CM | POA: Diagnosis not present

## 2020-10-05 DIAGNOSIS — S32000A Wedge compression fracture of unspecified lumbar vertebra, initial encounter for closed fracture: Secondary | ICD-10-CM | POA: Diagnosis not present

## 2020-10-05 DIAGNOSIS — F0391 Unspecified dementia with behavioral disturbance: Secondary | ICD-10-CM | POA: Diagnosis not present

## 2020-10-05 DIAGNOSIS — S0083XA Contusion of other part of head, initial encounter: Secondary | ICD-10-CM | POA: Diagnosis not present

## 2020-10-05 DIAGNOSIS — J9601 Acute respiratory failure with hypoxia: Secondary | ICD-10-CM | POA: Diagnosis not present

## 2020-10-05 DIAGNOSIS — R319 Hematuria, unspecified: Secondary | ICD-10-CM | POA: Diagnosis not present

## 2020-10-05 DIAGNOSIS — M1712 Unilateral primary osteoarthritis, left knee: Secondary | ICD-10-CM | POA: Diagnosis not present

## 2020-10-05 DIAGNOSIS — L89610 Pressure ulcer of right heel, unstageable: Secondary | ICD-10-CM | POA: Diagnosis not present

## 2020-10-05 DIAGNOSIS — Z743 Need for continuous supervision: Secondary | ICD-10-CM | POA: Diagnosis not present

## 2020-10-05 DIAGNOSIS — M1990 Unspecified osteoarthritis, unspecified site: Secondary | ICD-10-CM | POA: Diagnosis not present

## 2020-10-05 DIAGNOSIS — I10 Essential (primary) hypertension: Secondary | ICD-10-CM | POA: Diagnosis not present

## 2020-10-05 DIAGNOSIS — R41841 Cognitive communication deficit: Secondary | ICD-10-CM | POA: Diagnosis not present

## 2020-10-05 DIAGNOSIS — L89621 Pressure ulcer of left heel, stage 1: Secondary | ICD-10-CM | POA: Diagnosis not present

## 2020-10-05 DIAGNOSIS — Z043 Encounter for examination and observation following other accident: Secondary | ICD-10-CM | POA: Diagnosis not present

## 2020-10-05 DIAGNOSIS — R531 Weakness: Secondary | ICD-10-CM | POA: Diagnosis not present

## 2020-10-05 DIAGNOSIS — D692 Other nonthrombocytopenic purpura: Secondary | ICD-10-CM | POA: Diagnosis not present

## 2020-10-05 DIAGNOSIS — R0902 Hypoxemia: Secondary | ICD-10-CM | POA: Diagnosis not present

## 2020-10-05 DIAGNOSIS — F0390 Unspecified dementia without behavioral disturbance: Secondary | ICD-10-CM | POA: Diagnosis not present

## 2020-10-05 DIAGNOSIS — R5381 Other malaise: Secondary | ICD-10-CM | POA: Diagnosis not present

## 2020-10-05 DIAGNOSIS — J189 Pneumonia, unspecified organism: Secondary | ICD-10-CM | POA: Diagnosis not present

## 2020-10-05 DIAGNOSIS — R404 Transient alteration of awareness: Secondary | ICD-10-CM | POA: Diagnosis not present

## 2020-10-05 DIAGNOSIS — E1122 Type 2 diabetes mellitus with diabetic chronic kidney disease: Secondary | ICD-10-CM | POA: Diagnosis not present

## 2020-10-05 DIAGNOSIS — R279 Unspecified lack of coordination: Secondary | ICD-10-CM | POA: Diagnosis not present

## 2020-10-05 DIAGNOSIS — R609 Edema, unspecified: Secondary | ICD-10-CM | POA: Diagnosis not present

## 2020-10-05 DIAGNOSIS — G9341 Metabolic encephalopathy: Secondary | ICD-10-CM | POA: Diagnosis not present

## 2020-10-05 DIAGNOSIS — E119 Type 2 diabetes mellitus without complications: Secondary | ICD-10-CM | POA: Diagnosis not present

## 2020-10-05 DIAGNOSIS — Z9181 History of falling: Secondary | ICD-10-CM | POA: Diagnosis not present

## 2020-10-05 DIAGNOSIS — L98419 Non-pressure chronic ulcer of buttock with unspecified severity: Secondary | ICD-10-CM | POA: Diagnosis not present

## 2020-10-05 DIAGNOSIS — R339 Retention of urine, unspecified: Secondary | ICD-10-CM | POA: Diagnosis not present

## 2020-10-05 DIAGNOSIS — K219 Gastro-esophageal reflux disease without esophagitis: Secondary | ICD-10-CM | POA: Diagnosis not present

## 2020-10-05 DIAGNOSIS — I6529 Occlusion and stenosis of unspecified carotid artery: Secondary | ICD-10-CM | POA: Diagnosis not present

## 2020-10-05 DIAGNOSIS — E222 Syndrome of inappropriate secretion of antidiuretic hormone: Secondary | ICD-10-CM | POA: Diagnosis not present

## 2020-10-05 DIAGNOSIS — J45909 Unspecified asthma, uncomplicated: Secondary | ICD-10-CM | POA: Diagnosis not present

## 2020-10-05 DIAGNOSIS — N139 Obstructive and reflux uropathy, unspecified: Secondary | ICD-10-CM | POA: Diagnosis not present

## 2020-10-05 DIAGNOSIS — R1312 Dysphagia, oropharyngeal phase: Secondary | ICD-10-CM | POA: Diagnosis not present

## 2020-10-05 DIAGNOSIS — W19XXXA Unspecified fall, initial encounter: Secondary | ICD-10-CM | POA: Diagnosis not present

## 2020-10-05 DIAGNOSIS — I4891 Unspecified atrial fibrillation: Secondary | ICD-10-CM | POA: Diagnosis not present

## 2020-10-05 DIAGNOSIS — I129 Hypertensive chronic kidney disease with stage 1 through stage 4 chronic kidney disease, or unspecified chronic kidney disease: Secondary | ICD-10-CM | POA: Diagnosis not present

## 2020-10-05 DIAGNOSIS — L89159 Pressure ulcer of sacral region, unspecified stage: Secondary | ICD-10-CM | POA: Diagnosis not present

## 2020-10-06 DIAGNOSIS — S32000A Wedge compression fracture of unspecified lumbar vertebra, initial encounter for closed fracture: Secondary | ICD-10-CM | POA: Diagnosis not present

## 2020-10-06 DIAGNOSIS — R5381 Other malaise: Secondary | ICD-10-CM | POA: Diagnosis not present

## 2020-10-06 DIAGNOSIS — F0390 Unspecified dementia without behavioral disturbance: Secondary | ICD-10-CM | POA: Diagnosis not present

## 2020-10-06 DIAGNOSIS — R339 Retention of urine, unspecified: Secondary | ICD-10-CM | POA: Diagnosis not present

## 2020-10-11 DIAGNOSIS — F0390 Unspecified dementia without behavioral disturbance: Secondary | ICD-10-CM | POA: Diagnosis not present

## 2020-10-11 DIAGNOSIS — I4891 Unspecified atrial fibrillation: Secondary | ICD-10-CM | POA: Diagnosis not present

## 2020-10-11 DIAGNOSIS — D692 Other nonthrombocytopenic purpura: Secondary | ICD-10-CM | POA: Diagnosis not present

## 2020-10-11 DIAGNOSIS — R5381 Other malaise: Secondary | ICD-10-CM | POA: Diagnosis not present

## 2020-10-12 DIAGNOSIS — M1712 Unilateral primary osteoarthritis, left knee: Secondary | ICD-10-CM | POA: Diagnosis not present

## 2020-10-12 DIAGNOSIS — I10 Essential (primary) hypertension: Secondary | ICD-10-CM | POA: Diagnosis not present

## 2020-10-12 DIAGNOSIS — Z515 Encounter for palliative care: Secondary | ICD-10-CM | POA: Diagnosis not present

## 2020-10-12 DIAGNOSIS — F0391 Unspecified dementia with behavioral disturbance: Secondary | ICD-10-CM | POA: Diagnosis not present

## 2020-10-12 DIAGNOSIS — I4891 Unspecified atrial fibrillation: Secondary | ICD-10-CM | POA: Diagnosis not present

## 2020-10-12 DIAGNOSIS — Z9181 History of falling: Secondary | ICD-10-CM | POA: Diagnosis not present

## 2020-10-13 DIAGNOSIS — N139 Obstructive and reflux uropathy, unspecified: Secondary | ICD-10-CM | POA: Diagnosis not present

## 2020-10-13 DIAGNOSIS — L89621 Pressure ulcer of left heel, stage 1: Secondary | ICD-10-CM | POA: Diagnosis not present

## 2020-10-16 DIAGNOSIS — N189 Chronic kidney disease, unspecified: Secondary | ICD-10-CM | POA: Diagnosis not present

## 2020-10-16 DIAGNOSIS — J45909 Unspecified asthma, uncomplicated: Secondary | ICD-10-CM | POA: Diagnosis not present

## 2020-10-16 DIAGNOSIS — Z043 Encounter for examination and observation following other accident: Secondary | ICD-10-CM | POA: Diagnosis not present

## 2020-10-16 DIAGNOSIS — E1122 Type 2 diabetes mellitus with diabetic chronic kidney disease: Secondary | ICD-10-CM | POA: Diagnosis not present

## 2020-10-16 DIAGNOSIS — I129 Hypertensive chronic kidney disease with stage 1 through stage 4 chronic kidney disease, or unspecified chronic kidney disease: Secondary | ICD-10-CM | POA: Diagnosis not present

## 2020-10-16 DIAGNOSIS — I6529 Occlusion and stenosis of unspecified carotid artery: Secondary | ICD-10-CM | POA: Diagnosis not present

## 2020-10-16 DIAGNOSIS — S0083XA Contusion of other part of head, initial encounter: Secondary | ICD-10-CM | POA: Diagnosis not present

## 2020-10-16 DIAGNOSIS — F0390 Unspecified dementia without behavioral disturbance: Secondary | ICD-10-CM | POA: Diagnosis not present

## 2020-10-16 DIAGNOSIS — K219 Gastro-esophageal reflux disease without esophagitis: Secondary | ICD-10-CM | POA: Diagnosis not present

## 2020-10-16 DIAGNOSIS — Z8673 Personal history of transient ischemic attack (TIA), and cerebral infarction without residual deficits: Secondary | ICD-10-CM | POA: Diagnosis not present

## 2020-10-16 DIAGNOSIS — M1990 Unspecified osteoarthritis, unspecified site: Secondary | ICD-10-CM | POA: Diagnosis not present

## 2020-10-18 DIAGNOSIS — N139 Obstructive and reflux uropathy, unspecified: Secondary | ICD-10-CM | POA: Diagnosis not present

## 2020-10-18 DIAGNOSIS — F0390 Unspecified dementia without behavioral disturbance: Secondary | ICD-10-CM | POA: Diagnosis not present

## 2020-10-20 DIAGNOSIS — L89610 Pressure ulcer of right heel, unstageable: Secondary | ICD-10-CM | POA: Diagnosis not present

## 2020-10-25 DIAGNOSIS — Z515 Encounter for palliative care: Secondary | ICD-10-CM | POA: Diagnosis not present

## 2020-10-25 DIAGNOSIS — F0391 Unspecified dementia with behavioral disturbance: Secondary | ICD-10-CM | POA: Diagnosis not present

## 2020-10-25 DIAGNOSIS — Z9181 History of falling: Secondary | ICD-10-CM | POA: Diagnosis not present

## 2020-10-25 DIAGNOSIS — I10 Essential (primary) hypertension: Secondary | ICD-10-CM | POA: Diagnosis not present

## 2020-10-25 DIAGNOSIS — M1712 Unilateral primary osteoarthritis, left knee: Secondary | ICD-10-CM | POA: Diagnosis not present

## 2020-10-25 DIAGNOSIS — I4891 Unspecified atrial fibrillation: Secondary | ICD-10-CM | POA: Diagnosis not present

## 2020-10-27 DIAGNOSIS — L98419 Non-pressure chronic ulcer of buttock with unspecified severity: Secondary | ICD-10-CM | POA: Diagnosis not present

## 2020-11-01 DIAGNOSIS — R400 Somnolence: Secondary | ICD-10-CM | POA: Diagnosis not present

## 2020-11-01 DIAGNOSIS — F0390 Unspecified dementia without behavioral disturbance: Secondary | ICD-10-CM | POA: Diagnosis not present

## 2020-11-03 DIAGNOSIS — L98419 Non-pressure chronic ulcer of buttock with unspecified severity: Secondary | ICD-10-CM | POA: Diagnosis not present

## 2020-11-09 DIAGNOSIS — Z515 Encounter for palliative care: Secondary | ICD-10-CM | POA: Diagnosis not present

## 2020-11-10 DIAGNOSIS — L89320 Pressure ulcer of left buttock, unstageable: Secondary | ICD-10-CM | POA: Diagnosis not present

## 2020-11-19 DEATH — deceased

## 2020-12-16 ENCOUNTER — Ambulatory Visit: Payer: Self-pay | Admitting: Sports Medicine
